# Patient Record
Sex: Female | Born: 1950 | Race: White | Hispanic: No | Marital: Married | Smoking: Former smoker
Health system: Southern US, Community
[De-identification: ages and names within clinical notes are randomized; demographics above are authoritative.]

## PROBLEM LIST (undated history)

## (undated) DIAGNOSIS — K922 Gastrointestinal hemorrhage, unspecified: Secondary | ICD-10-CM

## (undated) DIAGNOSIS — K5790 Diverticulosis of intestine, part unspecified, without perforation or abscess without bleeding: Secondary | ICD-10-CM

## (undated) DIAGNOSIS — T7840XA Allergy, unspecified, initial encounter: Secondary | ICD-10-CM

## (undated) DIAGNOSIS — C801 Malignant (primary) neoplasm, unspecified: Secondary | ICD-10-CM

## (undated) DIAGNOSIS — K51411 Inflammatory polyps of colon with rectal bleeding: Secondary | ICD-10-CM

## (undated) DIAGNOSIS — M1990 Unspecified osteoarthritis, unspecified site: Secondary | ICD-10-CM

## (undated) HISTORY — DX: Allergy, unspecified, initial encounter: T78.40XA

## (undated) HISTORY — DX: Malignant (primary) neoplasm, unspecified: C80.1

## (undated) HISTORY — DX: Gastrointestinal hemorrhage, unspecified: K92.2

## (undated) HISTORY — PX: BREAST ENHANCEMENT SURGERY: SHX7

## (undated) HISTORY — DX: Diverticulosis of intestine, part unspecified, without perforation or abscess without bleeding: K57.90

## (undated) HISTORY — DX: Unspecified osteoarthritis, unspecified site: M19.90

## (undated) HISTORY — PX: TUBAL LIGATION: SHX77

---

## 1998-06-10 ENCOUNTER — Other Ambulatory Visit: Admission: RE | Admit: 1998-06-10 | Discharge: 1998-06-10 | Payer: Self-pay | Admitting: Obstetrics and Gynecology

## 1998-08-05 ENCOUNTER — Emergency Department (HOSPITAL_COMMUNITY): Admission: EM | Admit: 1998-08-05 | Discharge: 1998-08-05 | Payer: Self-pay | Admitting: Emergency Medicine

## 1999-12-20 ENCOUNTER — Encounter: Payer: Self-pay | Admitting: Gastroenterology

## 2002-02-18 ENCOUNTER — Encounter: Admission: RE | Admit: 2002-02-18 | Discharge: 2002-02-18 | Payer: Self-pay | Admitting: *Deleted

## 2002-02-28 ENCOUNTER — Encounter: Admission: RE | Admit: 2002-02-28 | Discharge: 2002-02-28 | Payer: Self-pay | Admitting: *Deleted

## 2009-01-27 ENCOUNTER — Encounter (INDEPENDENT_AMBULATORY_CARE_PROVIDER_SITE_OTHER): Payer: Self-pay | Admitting: *Deleted

## 2009-07-17 ENCOUNTER — Telehealth: Payer: Self-pay | Admitting: Gastroenterology

## 2009-08-28 ENCOUNTER — Ambulatory Visit: Payer: Self-pay | Admitting: Gastroenterology

## 2009-08-28 DIAGNOSIS — R1012 Left upper quadrant pain: Secondary | ICD-10-CM | POA: Insufficient documentation

## 2009-08-28 LAB — CONVERTED CEMR LAB
ALT: 15 units/L (ref 0–35)
AST: 19 units/L (ref 0–37)
Albumin: 4.5 g/dL (ref 3.5–5.2)
Alkaline Phosphatase: 53 units/L (ref 39–117)
BUN: 14 mg/dL (ref 6–23)
Basophils Absolute: 0 10*3/uL (ref 0.0–0.1)
Basophils Relative: 0.6 % (ref 0.0–3.0)
Bilirubin Urine: NEGATIVE
CO2: 28 meq/L (ref 19–32)
Calcium: 9.3 mg/dL (ref 8.4–10.5)
Chloride: 106 meq/L (ref 96–112)
Creatinine, Ser: 0.7 mg/dL (ref 0.4–1.2)
Eosinophils Absolute: 0 10*3/uL (ref 0.0–0.7)
Eosinophils Relative: 0.8 % (ref 0.0–5.0)
GFR calc non Af Amer: 88.88 mL/min (ref >60)
Glucose, Bld: 128 mg/dL — ABNORMAL HIGH (ref 70–99)
HCT: 40.4 % (ref 36.0–46.0)
Hemoglobin, Urine: NEGATIVE
Hemoglobin: 13.7 g/dL (ref 12.0–15.0)
Ketones, ur: NEGATIVE mg/dL
Leukocytes, UA: NEGATIVE
Lymphocytes Relative: 29.9 % (ref 12.0–46.0)
Lymphs Abs: 1.8 10*3/uL (ref 0.7–4.0)
MCHC: 34 g/dL (ref 30.0–36.0)
MCV: 85 fL (ref 78.0–100.0)
Monocytes Absolute: 0.5 10*3/uL (ref 0.1–1.0)
Monocytes Relative: 7.6 % (ref 3.0–12.0)
Neutro Abs: 3.6 10*3/uL (ref 1.4–7.7)
Neutrophils Relative %: 61.1 % (ref 43.0–77.0)
Nitrite: NEGATIVE
Platelets: 284 10*3/uL (ref 150.0–400.0)
Potassium: 4.8 meq/L (ref 3.5–5.1)
RBC: 4.75 M/uL (ref 3.87–5.11)
RDW: 13.6 % (ref 11.5–14.6)
Sodium: 141 meq/L (ref 135–145)
Specific Gravity, Urine: 1.015 (ref 1.000–1.030)
Total Bilirubin: 0.7 mg/dL (ref 0.3–1.2)
Total Protein, Urine: NEGATIVE mg/dL
Total Protein: 7.4 g/dL (ref 6.0–8.3)
Urine Glucose: NEGATIVE mg/dL
Urobilinogen, UA: 0.2 (ref 0.0–1.0)
WBC: 5.9 10*3/uL (ref 4.5–10.5)
pH: 7.5 (ref 5.0–8.0)

## 2009-08-31 ENCOUNTER — Telehealth: Payer: Self-pay | Admitting: Gastroenterology

## 2009-09-04 ENCOUNTER — Telehealth: Payer: Self-pay | Admitting: Gastroenterology

## 2010-03-23 NOTE — Progress Notes (Signed)
Summary: Triage  Phone Note Call from Patient Call back at Home Phone [phone]   Caller: Patient Call For: Dr. Arlyce Dice Reason for Call: Talk to Nurse Summary of Call: pt was sch'd w/Dr. Jarold Motto for 07-22-09. She is a Magazine features editor pt. I resch'd her until 08-28-09 and she does not feel like she can wait that long. She is having left flank pain. Initial call taken by: Karna Christmas,  Jul 17, 2009 3:06 PM  Follow-up for Phone Call        Pt. is very upset, she states her appt. has been scheduled for 3 weeks, she even got a reminder call. She took the morning off work and now, on Friday afternoon she was called and her appt. cancelled and she wasn't given another appt. until 08-28-09. I have worked her in with Amy for 07-21-09, but the pt. is still very upset with this office.  Follow-up by: Laureen Ochs LPN,  Jul 17, 2009 3:20 PM  Additional Follow-up for Phone Call Additional follow up Details #1::        Pt. called back on 07-17-09 and rescheduled appt. to July. Yesi states pt. wasn't upset and was fine with new appt.date/time.  Additional Follow-up by: Laureen Ochs LPN,  Jul 21, 2009 11:23 AM

## 2010-03-23 NOTE — Procedures (Signed)
Summary: Colon   Colonoscopy  Procedure date:  12/20/1999  Findings:      Location:  [facility].   Patient Name: Nancy, Ellis MRN:  Procedure Procedures: Colonoscopy CPT: [phone].  Personnel: Endoscopist: Barbette Hair. Arlyce Dice, MD.  Exam Location: Exam performed in [facility]. Outpatient  Patient Consent: Procedure, Alternatives, Risks and Benefits discussed, consent obtained, from patient.  Indications Symptoms: Diarrhea  History  Pre-Exam Physical: Performed Dec 20, 1999. Cardio-pulmonary exam, Rectal exam, HEENT exam , Abdominal exam, Extremity exam, Neurological exam WNL.  Exam Exam: Extent of exam reached: Cecum, extent intended: Cecum.  Patient position: on left side. ASA Classification: I. Tolerance: excellent.  Monitoring: Pulse and BP monitoring, Oximetry used. Supplemental O2 given.  Colon Prep Used Golytely for colon prep. Prep results: excellent.  Sedation Meds: Fentanyl 75 mcg. Versed 7 mg.  Findings - DIVERTICULOSIS: Ascending Colon. ICD9: Diverticulosis: 562.10. Comments: few tics.  - DIVERTICULOSIS: Descending Colon to Sigmoid Colon. Comments: scattered diverticula.   Assessment Abnormal examination, see findings above.  Diagnoses: 562.10: Diverticulosis.   Events  Unplanned Interventions: No intervention was required.  Unplanned Events: There were no complications. Plans Patient Education: Patient given standard instructions for: Diverticulosis.  Disposition: After procedure patient sent to recovery. After recovery patient sent home.  Scheduling/Referral: Follow-Up prn. Clinic Visit,    This report was created from the original endoscopy report, which was reviewed and signed by the above listed endoscopist.

## 2010-03-23 NOTE — Assessment & Plan Note (Signed)
Summary: left flank pain...em   History of Present Illness Visit Type: consult  Primary GI MD: Melvia Heaps MD [facility] Primary Provider: Tally Joe, MD Requesting Provider: Harold Hedge, MD Chief Complaint: Left side pain that radiates to her back  History of Present Illness:   [name] is a pleasant 60 year old white female referred at the request of Dr. Henderson Cloud for evaluation of abdominal discomfort. For the  past 2 months she has been complaining of low-grade but persistent discomfort in the left upper quadrant that radiates both to her left flank and left lower quadrant.  It is unrelated to eating or bowel movements.  There has been no change in bowel habits.  She denies melena or hematochezia.  About 2 months ago she was treated initially with Macrobid then Cipro for a UTI characterized by dysuria and urinary frequency.  She denies these symptoms at present.  She also denies fevers but may have some sweats at night.  She has a history of diverticulosis seen at  colonoscopy 10 years ago.   GI Review of Systems    Reports abdominal pain.     Location of  Abdominal pain: left side.    Denies acid reflux, belching, bloating, chest pain, dysphagia with liquids, dysphagia with solids, heartburn, loss of appetite, nausea, vomiting, vomiting blood, weight loss, and  weight gain.        Denies anal fissure, black tarry stools, change in bowel habit, constipation, diarrhea, diverticulosis, fecal incontinence, heme positive stool, hemorrhoids, irritable bowel syndrome, jaundice, light color stool, liver problems, rectal bleeding, and  rectal pain.    Current Medications (verified): 1)  Aspirin 81 Mg Tbec (Aspirin) .Marland Kitchen.. 1 By Mouth Once Daily 2)  Calcium Lactate 650 Mg Tabs (Calcium Lactate) .... One Tablet By Mouth Two Times A Day  Allergies (verified): No Known Drug Allergies  Past History:  Past Medical History: Reviewed history from 08/21/2009 and no changes  required. Diverticulosis  Past Surgical History: Reviewed history from 08/21/2009 and no changes required. Tubal Ligation 2 child births Breast  augmentation  Family History: No FH of Colon Cancer:  Social History: [employer]  Married Patient is a former smoker: In Highschool Alcohol Use - no Daily Caffeine Use: One cup of coffee daily  Illicit Drug Use - no Smoking Status:  quit Drug Use:  no  Review of Systems       The patient complains of back pain.  The patient denies allergy/sinus, anemia, anxiety-new, arthritis/joint pain, blood in urine, breast changes/lumps, change in vision, confusion, cough, coughing up blood, depression-new, fainting, fatigue, fever, headaches-new, hearing problems, heart murmur, heart rhythm changes, itching, menstrual pain, muscle pains/cramps, night sweats, nosebleeds, pregnancy symptoms, shortness of breath, skin rash, sleeping problems, sore throat, swelling of feet/legs, swollen lymph glands, thirst - excessive , urination - excessive , urination changes/pain, urine leakage, vision changes, and voice change.         All other systems were reviewed and were negative    Vital Signs:  Patient profile:   60 year old female Height:      65 inches Weight:      127 pounds BMI:     21.21 BSA:     1.63 Pulse rate:   68 / minute Pulse rhythm:   regular BP sitting:   98 / 60  (left arm) Cuff size:   regular  Vitals Entered By: Ok Anis CMA (August 28, 2009 3:37 PM)  Physical Exam  Additional Exam:  She is a well-developed well-nourished female  skin: anicteric HEENT: normocephalic; PEERLA; no nasal or pharyngeal abnormalities neck: supple nodes: no cervical lymphadenopathy chest: clear to ausculatation and percussion heart: no murmurs, gallops, or rubs abd: soft, nontender; BS normoactive; no abdominal masses, organomegaly; there is very slight tenderness to deep palpation in the left upper quadrant and left  periumbilical areas.  There is no CVA tenderness rectal: deferred ext: no cynanosis, clubbing, edema skeletal: no deformities neuro: oriented x 3; no focal abnormalities    Impression & Recommendations:  Problem # 1:  ABDOMINAL PAIN, LEFT UPPER QUADRANT (ICD-789.02) Symptoms could be due to a very low-grade pyelonephritis  or residual inflammation in the urinary tract.  Pain from diverticulitis or visceral pain from the stomach or pancreas is less likely.  Recommendations #1 check urinalysis and culture #2 CBC and copper has a metabolic profile #3 to consider trial of anticholinergics if the above studies are not diagnostic Orders: TLB-Udip w/ Micro (81001-URINE) T-Culture, Urine ([id number]) TLB-CBC Platelet - w/Differential (85025-CBCD) TLB-CMP (Comprehensive Metabolic Pnl) (80053-COMP)  Patient Instructions: 1)  Copy sent to : Tally Joe, MD   Harold Hedge, MD 2)  You will go to the basement today for labs 3)  The medication list was reviewed and reconciled.  All changed / newly prescribed medications were explained.  A complete medication list was provided to the patient / caregiver.

## 2010-03-23 NOTE — Progress Notes (Signed)
Summary: Condition Update  Phone Note Call from Patient Call back at Home Phone [phone]   Caller: Patient Call For: Dr. Arlyce Dice Reason for Call: Talk to Nurse Summary of Call: pt. said her symptoms are "better" and wants to know if she should continue taking the  HYOSCYAMINE SULFATE med. Initial call taken by: Karna Christmas,  September 04, 2009 8:39 AM  Follow-up for Phone Call        Message left for patient to callback. Laureen Ochs LPN  September 04, 2009 9:25am  Message left for pt. that she can use Hyomax twice daily as needed for abd.pain. Pt. instructed to call back as needed.  Follow-up by: Laureen Ochs LPN,  September 04, 2009 2:15 PM

## 2010-03-23 NOTE — Progress Notes (Signed)
Summary: Triage  ---- Converted from flag ---- ---- 08/31/2009 11:33 AM, Louis Meckel MD wrote: Please tell pt urine results and lab were normal. Begin hyomax 0.375mg  two times a day.  c/b at end of week. ------------------------------  Phone Note Outgoing Call   Call placed by: Laureen Ochs LPN,  August 31, 2009 12:42 PM Call placed to: Patient Summary of Call: Message left for pt. with above MD instructions. Med to her pharmacy. Pt. instructed to call back at the end of the week, sooner as needed. Initial call taken by: Laureen Ochs LPN,  August 31, 2009 12:44 PM    New/Updated Medications: HYOSCYAMINE SULFATE CR 0.375 MG  TB12 (HYOSCYAMINE SULFATE) Take 1 by mouth twice daily. Prescriptions: HYOSCYAMINE SULFATE CR 0.375 MG  TB12 (HYOSCYAMINE SULFATE) Take 1 by mouth twice daily.  #60 x 1   Entered by:   Laureen Ochs LPN   Authorized by:   Louis Meckel MD   Signed by:   Laureen Ochs LPN on 16/11/9602   Method used:   Electronically to        [facility] [phone]* (retail)       [address]       [place], [place]  [address]       Ph: [phone]       Fax: [phone]   RxID:   [id number]

## 2011-02-22 HISTORY — PX: COLONOSCOPY: SHX174

## 2011-10-26 ENCOUNTER — Encounter: Payer: Self-pay | Admitting: Gastroenterology

## 2011-10-26 ENCOUNTER — Ambulatory Visit (INDEPENDENT_AMBULATORY_CARE_PROVIDER_SITE_OTHER): Payer: Commercial Managed Care - PPO | Admitting: Gastroenterology

## 2011-10-26 VITALS — BP 100/70 | HR 76 | Ht 65.0 in | Wt 130.4 lb

## 2011-10-26 DIAGNOSIS — R197 Diarrhea, unspecified: Secondary | ICD-10-CM | POA: Insufficient documentation

## 2011-10-26 MED ORDER — NA SULFATE-K SULFATE-MG SULF 17.5-3.13-1.6 GM/177ML PO SOLN: 1.0000 | Freq: Once | ORAL | Status: DC

## 2011-10-26 NOTE — Assessment & Plan Note (Addendum)
Patient has a persistent change in bowel habits following what sounds like a possible infectious diarrheal illness.  Postinfectious IBS is a possibility. Microscopic colitis or structural abnormalities of the colon including inflammation and neoplasm are less likely.  Recommendations #1 align daily for 10-14 days #2 colonoscopy

## 2011-10-26 NOTE — Progress Notes (Signed)
History of Present Illness: Pleasant 61 year old white female referred for evaluation of diarrhea. For the last 8 weeks she has been complaining of postprandial diarrhea. This is accompanied by severe urgency and crampy lower bowel pain. Symptoms have slowly improved although they remain. Imodium slows down her bowel movements but they are not yet solid. There is no history of travel, antibiotic use, change in medicines or diet. She denies hematochezia. Colonoscopy in 2001 demonstrated diverticulosis.    Past Medical History  Diagnosis Date  . Diverticulosis    Past Surgical History  Procedure Date  . Tubal ligation   . Breast enhancement surgery    family history includes Colon polyps in her mother; Diabetes in her maternal grandmother and sister; Heart attack in her father; and Rheum arthritis in her mother. Current Outpatient Prescriptions  Medication Sig Dispense Refill  . Calcium Carbonate (CALCIUM 600 PO) Take 1 tablet by mouth 2 (two) times daily.       Allergies as of 10/26/2011  . (No Known Allergies)    reports that she has quit smoking. Her smoking use included Cigarettes. She has never used smokeless tobacco. She reports that she does not drink alcohol or use illicit drugs.     Review of Systems: Pertinent positive and negative review of systems were noted in the above HPI section. All other review of systems were otherwise negative.  Vital signs were reviewed in today's medical record Physical Exam: General: Well developed , well nourished, no acute distress Head: Normocephalic and atraumatic Eyes:  sclerae anicteric, EOMI Ears: Normal auditory acuity Mouth: No deformity or lesions Neck: Supple, no masses or thyromegaly Lungs: Clear throughout to auscultation Heart: Regular rate and rhythm; no murmurs, rubs or bruits Abdomen: Soft, non tender and non distended. No masses, hepatosplenomegaly or hernias noted. Normal Bowel sounds Rectal:deferred Musculoskeletal:  Symmetrical with no gross deformities  Skin: No lesions on visible extremities Pulses:  Normal pulses noted Extremities: No clubbing, cyanosis, edema or deformities noted Neurological: Alert oriented x 4, grossly nonfocal Cervical Nodes:  No significant cervical adenopathy Inguinal Nodes: No significant inguinal adenopathy Psychological:  Alert and cooperative. Normal mood and affect

## 2011-10-26 NOTE — Patient Instructions (Addendum)
Colonoscopy A colonoscopy is an exam to evaluate your entire colon. In this exam, your colon is cleansed. A long fiberoptic tube is inserted through your rectum and into your colon. The fiberoptic scope (endoscope) is a long bundle of enclosed and very flexible fibers. These fibers transmit light to the area examined and send images from that area to your caregiver. Discomfort is usually minimal. You may be given a drug to help you sleep (sedative) during or prior to the procedure. This exam helps to detect lumps (tumors), polyps, inflammation, and areas of bleeding. Your caregiver may also take a small piece of tissue (biopsy) that will be examined under a microscope. LET YOUR CAREGIVER KNOW ABOUT:   Allergies to food or medicine.   Medicines taken, including vitamins, herbs, eyedrops, over-the-counter medicines, and creams.   Use of steroids (by mouth or creams).   Previous problems with anesthetics or numbing medicines.   History of bleeding problems or blood clots.   Previous surgery.   Other health problems, including diabetes and kidney problems.   Possibility of pregnancy, if this applies.  BEFORE THE PROCEDURE   A clear liquid diet may be required for 2 days before the exam.   Ask your caregiver about changing or stopping your regular medications.   Liquid injections (enemas) or laxatives may be required.   A large amount of electrolyte solution may be given to you to drink over a short period of time. This solution is used to clean out your colon.   You should be present 60 minutes prior to your procedure or as directed by your caregiver.  AFTER THE PROCEDURE   If you received a sedative or pain relieving medication, you will need to arrange for someone to drive you home.   Occasionally, there is a little blood passed with the first bowel movement. Do not be concerned.  FINDING OUT THE RESULTS OF YOUR TEST Not all test results are available during your visit. If your test  results are not back during the visit, make an appointment with your caregiver to find out the results. Do not assume everything is normal if you have not heard from your caregiver or the medical facility. It is important for you to follow up on all of your test results. HOME CARE INSTRUCTIONS   It is not unusual to pass moderate amounts of gas and experience mild abdominal cramping following the procedure. This is due to air being used to inflate your colon during the exam. Walking or a warm pack on your belly (abdomen) may help.   You may resume all normal meals and activities after sedatives and medicines have worn off.   Only take over-the-counter or prescription medicines for pain, discomfort, or fever as directed by your caregiver. Do not use aspirin or blood thinners if a biopsy was taken. Consult your caregiver for medicine usage if biopsies were taken.  SEEK IMMEDIATE MEDICAL CARE IF:   You have a fever.   You pass large blood clots or fill a toilet with blood following the procedure. This may also occur 10 to 14 days following the procedure. This is more likely if a biopsy was taken.   You develop abdominal pain that keeps getting worse and cannot be relieved with medicine.  Document Released: 02/05/2000 Document Revised: 01/27/2011 Document Reviewed: 09/20/2007 [employer] Patient Information 2012 [employer]. 

## 2011-10-31 ENCOUNTER — Encounter: Payer: Self-pay | Admitting: Gastroenterology

## 2011-12-15 ENCOUNTER — Inpatient Hospital Stay (HOSPITAL_COMMUNITY)
Admission: EM | Admit: 2011-12-15 | Discharge: 2011-12-18 | DRG: 920 | Disposition: A | Discharge status: Home / Self Care | Payer: Commercial Managed Care - PPO | Attending: Internal Medicine | Admitting: Internal Medicine

## 2011-12-15 ENCOUNTER — Encounter: Payer: Self-pay | Admitting: Gastroenterology

## 2011-12-15 ENCOUNTER — Ambulatory Visit (AMBULATORY_SURGERY_CENTER): Payer: Commercial Managed Care - PPO | Admitting: Gastroenterology

## 2011-12-15 ENCOUNTER — Telehealth: Payer: Self-pay | Admitting: Internal Medicine

## 2011-12-15 ENCOUNTER — Encounter (HOSPITAL_COMMUNITY): Payer: Self-pay | Admitting: *Deleted

## 2011-12-15 VITALS — BP 109/51 | HR 71 | Temp 98.6°F | Resp 21 | Ht 65.0 in | Wt 130.0 lb

## 2011-12-15 DIAGNOSIS — D62 Acute posthemorrhagic anemia: Secondary | ICD-10-CM | POA: Diagnosis present

## 2011-12-15 DIAGNOSIS — IMO0002 Reserved for concepts with insufficient information to code with codable children: Secondary | ICD-10-CM

## 2011-12-15 DIAGNOSIS — Y849 Medical procedure, unspecified as the cause of abnormal reaction of the patient, or of later complication, without mention of misadventure at the time of the procedure: Secondary | ICD-10-CM | POA: Diagnosis present

## 2011-12-15 DIAGNOSIS — D126 Benign neoplasm of colon, unspecified: Secondary | ICD-10-CM

## 2011-12-15 DIAGNOSIS — K922 Gastrointestinal hemorrhage, unspecified: Secondary | ICD-10-CM

## 2011-12-15 DIAGNOSIS — K625 Hemorrhage of anus and rectum: Secondary | ICD-10-CM

## 2011-12-15 DIAGNOSIS — R197 Diarrhea, unspecified: Secondary | ICD-10-CM

## 2011-12-15 DIAGNOSIS — K5730 Diverticulosis of large intestine without perforation or abscess without bleeding: Secondary | ICD-10-CM

## 2011-12-15 HISTORY — DX: Inflammatory polyps of colon with rectal bleeding: K51.411

## 2011-12-15 LAB — CBC WITH DIFFERENTIAL/PLATELET
Eosinophils Absolute: 0.1 10*3/uL (ref 0.0–0.7)
Eosinophils Relative: 1 % (ref 0–5)
HCT: 38.5 % (ref 36.0–46.0)
Hemoglobin: 12.7 g/dL (ref 12.0–15.0)
Lymphocytes Relative: 20 % (ref 12–46)
Lymphs Abs: 1.8 10*3/uL (ref 0.7–4.0)
MCH: 28 pg (ref 26.0–34.0)
MCV: 84.8 fL (ref 78.0–100.0)
Monocytes Absolute: 0.7 10*3/uL (ref 0.1–1.0)
Monocytes Relative: 7 % (ref 3–12)
Platelets: 300 10*3/uL (ref 150–400)
RBC: 4.54 MIL/uL (ref 3.87–5.11)
WBC: 9.1 10*3/uL (ref 4.0–10.5)

## 2011-12-15 LAB — COMPREHENSIVE METABOLIC PANEL
ALT: 9 U/L (ref 0–35)
BUN: 16 mg/dL (ref 6–23)
CO2: 25 mEq/L (ref 19–32)
Calcium: 9.1 mg/dL (ref 8.4–10.5)
Creatinine, Ser: 0.75 mg/dL (ref 0.50–1.10)
GFR calc Af Amer: >90 mL/min (ref >90)
GFR calc non Af Amer: >90 mL/min (ref >90)
Glucose, Bld: 131 mg/dL — ABNORMAL HIGH (ref 70–99)
Total Protein: 6.7 g/dL (ref 6.0–8.3)

## 2011-12-15 LAB — PROTIME-INR: Prothrombin Time: 13.9 seconds (ref 11.6–15.2)

## 2011-12-15 MED ORDER — DEXTROSE 5 % AND 0.45 % NACL IV BOLUS
125.0000 mL | INTRAVENOUS | Status: DC
  Administered 2011-12-15 – 2011-12-16 (×3): 125 mL via INTRAVENOUS

## 2011-12-15 MED ORDER — SODIUM CHLORIDE 0.9 % IV BOLUS (SEPSIS): 1000.0000 mL | Freq: Once | INTRAVENOUS | Status: DC

## 2011-12-15 MED ORDER — DEXTROSE 5 % AND 0.45 % NACL IV BOLUS: 1000.0000 mL | INTRAVENOUS | Status: DC

## 2011-12-15 MED ORDER — ACETAMINOPHEN 325 MG PO TABS: 650.0000 mg | ORAL_TABLET | ORAL | Status: DC | PRN

## 2011-12-15 MED ORDER — SODIUM CHLORIDE 0.9 % IV SOLN: 500.0000 mL | INTRAVENOUS | Status: DC

## 2011-12-15 MED ORDER — SODIUM CHLORIDE 0.9 % IV BOLUS (SEPSIS)
1000.0000 mL | Freq: Once | INTRAVENOUS | Status: AC
  Administered 2011-12-15: 1000 mL via INTRAVENOUS

## 2011-12-15 NOTE — ED Notes (Signed)
Patient is alert and oriented x3.  She is complaining of rectal bleeding that started at [facility] this evening  After she had a colonoscopy this morning at 9am where 2 polyps were removed.  She states that she has had a few intermittent  Sharp abdominal pains since the bleeding started.  She denies any nausea.

## 2011-12-15 NOTE — H&P (Signed)
Primary Care Physician:  Sissy Hoff, MD Primary Gastroenterologist:  Dr.Robert Arlyce Dice  CHIEF COMPLAINT:  5 bloody bowl movements  HPI: Nancy Ellis is a 61 y.o. female  Who underwent colonoscopy at 9.00 today in [facility] with findings of 2 polyps: 5 mm cecal polyp was removed with cold snare, and larger 15 mm sessile polyp in the left colon removed after saline injection with a hot snare, she had lunch and supper and later around 7.00pm felt an urge to have a BM. She had a large bloody stool and quickly called me and I referred her to [facility] ED  And have called ED with orders. She has no hx of bleeding or anemia. She saw Dr Arlyce Dice 2 months ago for urgent diarrhea. She had a prior colonoscopy in 2001 which showed diverticulosis.Tonight, pt denies presyncopal episode, abd.pain, nor vomitting. Denies taking ASA or NSAID's    Past Medical History  Diagnosis Date  . Diverticulosis   . 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 Tubal ligation   . Breast enhancement surgery     Prior to Admission medications   Medication Sig Start Date End Date Taking? Authorizing Provider  Calcium Carbonate (CALCIUM 600 PO) Take 1 tablet by mouth 2 (two) times daily.   Yes Historical Provider, MD  naproxen (NAPROSYN) 250 MG tablet Take 250 mg by mouth 2 (two) times daily with a meal.   Yes Historical Provider, MD    Current Facility-Administered Medications  Medication Dose Route Frequency Provider Last Rate Last Dose  . sodium chloride 0.9 % bolus 1,000 mL  1,000 mL Intravenous Once Otilio Miu, PA   1,000 mL at 12/15/11 2130   Current Outpatient Prescriptions  Medication Sig Dispense Refill  . Calcium Carbonate (CALCIUM 600 PO) Take 1 tablet by mouth 2 (two) times daily.      . naproxen (NAPROSYN) 250 MG tablet Take 250 mg by mouth 2 (two) times daily with a meal.       Facility-Administered Medications Ordered in Other Encounters    Medication Dose Route Frequency Provider Last Rate Last Dose  . 0.9 %  sodium chloride infusion  500 mL Intravenous Continuous Louis Meckel, MD        Allergies as of 12/15/2011  . (No Known Allergies)    Family History  Problem Relation Age of Onset  . Diabetes Maternal Grandmother   . Diabetes Sister   . Rheum arthritis Mother   . Heart attack Father   . Colon polyps Mother     History   Social History  . Marital Status: Married    Spouse Name: N/A    Number of Children: 2  . Years of Education: N/A   Occupational History  . Mining engineer    Social History Main Topics  . Smoking status: Former Smoker    Types: Cigarettes  . Smokeless tobacco: Never Used  . Alcohol Use: No  . Drug Use: No  . Sexually Active: Not on file   Other Topics Concern  . Not on file   Social History Narrative  . No narrative on file    Review of Systems: Gen: Denies any fever, chills, sweats, anorexia, fatigue, weakness, malaise, weight loss, and sleep disorder CV: Denies chest pain, angina, palpitations, syncope, orthopnea, PND, peripheral edema, and claudication. Resp: Denies dyspnea at rest, dyspnea with exercise, cough, sputum, wheezing, coughing up blood, and pleurisy. GI: Denies vomiting  blood, jaundice, and fecal incontinence.   Denies dysphagia or odynophagia. GU : Denies urinary burning, blood in urine, urinary frequency, urinary hesitancy, nocturnal urination, and urinary incontinence. MS: Denies joint pain, limitation of movement, and swelling, stiffness, low back pain, extremity pain. Denies muscle weakness, cramps, atrophy.  Derm: Denies rash, itching, dry skin, hives, moles, warts, or unhealing ulcers.  Psych: Denies depression, anxiety, memory loss, suicidal ideation, hallucinations, paranoia, and confusion. Heme: Denies bruising, bleeding, and enlarged lymph nodes. Neuro:  Denies any headaches, dizziness, paresthesias. Endo:  Denies any problems with DM,  thyroid, adrenal function.  Physical Exam: Vital signs in last 24 hours: Temp:  [98.5 F (36.9 C)-98.6 F (37 C)] 98.5 F (36.9 C) (10/24 2024) Pulse Rate:  [71-97] 89  (10/24 2130) Resp:  [14-23] 18  (10/24 2024) BP: (92-121)/(51-76) 115/76 mmHg (10/24 2024) SpO2:  [89 %-100 %] 100 % (10/24 2130) Weight:  [130 lb (58.968 kg)] 130 lb (58.968 kg) (10/24 0827)   General:   Alert,  Well-developed, well-nourished, pleasant and cooperative in NAD Head:  Normocephalic and atraumatic. Eyes:  Sclera clear, no icterus.   Conjunctiva pink. Ears:  Normal auditory acuity. Nose:  No deformity, discharge,  or lesions. Mouth:  No deformity or lesions.  Oropharynx pink & moist. Neck:  Supple; no masses or thyromegaly. Lungs:  Clear throughout to auscultation.   No wheezes, crackles, or rhonchi. No acute distress. Heart:  Regular rate and rhythm; no murmurs, clicks, rubs,  or gallops. Abdomen:  Mil;d tenderness LLQ, no rebound and nondistended. No masses, hepatosplenomegaly or hernias noted. Normal bowel sounds, without guarding, and without rebound.   Rectal: reg blood on glove, no stool, heme positive, no hemorrhoids, also fresh blood smeared around the rectum   Msk:  Symmetrical without gross deformities. Normal posture. Pulses:  Normal pulses noted. Extremities:  Without clubbing or edema. Neurologic:  Alert and  oriented x4;  grossly normal neurologically. Skin:  Intact without significant lesions or rashes. Cervical Nodes:  No significant cervical adenopathy. Psych:  Alert and cooperative. Normal mood and affect.  Intake/Output from previous day:   Intake/Output this shift: Total I/O In: -  Out: 2 [Stool:2]  Lab Results:  [facility] 12/15/11 2100  WBC 9.1  HGB 12.7  HCT 38.5  PLT 300   BMET  Basename 12/15/11 2100  NA 136  K 3.7  CL 100  CO2 25  GLUCOSE 131*  BUN 16  CREATININE 0.75  CALCIUM 9.1   LFT  Basename 12/15/11 2100  PROT 6.7  ALBUMIN 4.0  AST 15  ALT 9    ALKPHOS 60  BILITOT 0.5  BILIDIR --  IBILI --   PT/INR  Basename 12/15/11 2130  LABPROT 13.5  INR 1.04   Hepatitis Panel No results found for this basename: HEPBSAG,HCVAB,HEPAIGM,HEPBIGM in the last 72 hours  Studies/Results: No results found.   IMPRESSION / PLAN:   Acute lower GIB, likely postpolypectomy although I cannot r/o diverticular bleed. She is tender in the LLQ,??polypectomy site. She is stable hemodynamically. Hgb is 12.7, it is likely to drop since she has had 5 stools in past 2 hours IV fluids Monitor H/H q 4 hrs Bowl rest Clear liquids Type and hold- TXn for Hgb < 9 Possible flexible sigmoidoscopy and hemostasis, endo clip placement if bleeding continues Discussed with the pt and her husband      LOS: 0 days   Primary Care Physician:  Sissy Hoff, MD Primary Gastroenterologist:  Dewaine Conger  CHIEF COMPLAINT:  Passing  blod, 5 large bowl movements from 7.00pm till 10.00 pm  HPI: Nancy Ellis is a 61 y.o. female had colonoscopy earlier today, developed painless bleeding about 7 hours post procedure. 2 polyps removed- 5 mm cecal polyp and 15mm  Left colon poly- hor snare and saline injection. No hx of NSAIDs   Past Medical History  Diagnosis Date  . Diverticulosis   . 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 Tubal ligation   . Breast enhancement surgery     Prior to Admission medications   Medication Sig Start Date End Date Taking? Authorizing Provider  Calcium Carbonate (CALCIUM 600 PO) Take 1 tablet by mouth 2 (two) times daily.   Yes Historical Provider, MD  naproxen (NAPROSYN) 250 MG tablet Take 250 mg by mouth 2 (two) times daily with a meal.   Yes Historical Provider, MD    Current Facility-Administered Medications  Medication Dose Route Frequency Provider Last Rate Last Dose  . sodium chloride 0.9 % bolus 1,000 mL  1,000 mL Intravenous Once Otilio Miu, PA   1,000  mL at 12/15/11 2130   Current Outpatient Prescriptions  Medication Sig Dispense Refill  . Calcium Carbonate (CALCIUM 600 PO) Take 1 tablet by mouth 2 (two) times daily.      . naproxen (NAPROSYN) 250 MG tablet Take 250 mg by mouth 2 (two) times daily with a meal.       Facility-Administered Medications Ordered in Other Encounters  Medication Dose Route Frequency Provider Last Rate Last Dose  . 0.9 %  sodium chloride infusion  500 mL Intravenous Continuous Louis Meckel, MD        Allergies as of 12/15/2011  . (No Known Allergies)    Family History  Problem Relation Age of Onset  . Diabetes Maternal Grandmother   . Diabetes Sister   . Rheum arthritis Mother   . Heart attack Father   . Colon polyps Mother     History   Social History  . Marital Status: Married    Spouse Name: N/A    Number of Children: 2  . Years of Education: N/A   Occupational History  . Mining engineer    Social History Main Topics  . Smoking status: Former Smoker    Types: Cigarettes  . Smokeless tobacco: Never Used  . Alcohol Use: No  . Drug Use: No  . Sexually Active: Not on file   Other Topics Concern  . Not on file   Social History Narrative  . No narrative on file    Review of Systems: Gen: Denies any fever, chills, sweats, anorexia, fatigue, weakness, malaise, weight loss, and sleep disorder CV: Denies chest pain, angina, palpitations, syncope, orthopnea, PND, peripheral edema, and claudication. Resp: Denies dyspnea at rest, dyspnea with exercise, cough, sputum, wheezing, coughing up blood, and pleurisy. GI: Denies vomiting blood, jaundice, and fecal incontinence.   Denies dysphagia or odynophagia. GU : Denies urinary burning, blood in urine, urinary frequency, urinary hesitancy, nocturnal urination, and urinary incontinence. MS: Denies joint pain, limitation of movement, and swelling, stiffness, low back pain, extremity pain. Denies muscle weakness, cramps, atrophy.  Derm:  Denies rash, itching, dry skin, hives, moles, warts, or unhealing ulcers.  Psych: Denies depression, anxiety, memory loss, suicidal ideation, hallucinations, paranoia, and confusion. Heme: Denies bruising, bleeding, and enlarged lymph nodes. Neuro:  Denies any headaches, dizziness, paresthesias. Endo:  Denies any problems with DM, thyroid, adrenal function.  Physical Exam: Vital signs in last 24 hours: Temp:  [98.5 F (36.9 C)-98.6 F (37 C)] 98.5 F (36.9 C) (10/24 2024) Pulse Rate:  [71-97] 89  (10/24 2130) Resp:  [14-23] 18  (10/24 2024) BP: (92-121)/(51-76) 115/76 mmHg (10/24 2024) SpO2:  [89 %-100 %] 100 % (10/24 2130) Weight:  [130 lb (58.968 kg)] 130 lb (58.968 kg) (10/24 0827)   General:   Alert,  Well-developed, well-nourished, pleasant and cooperative in NAD Head:  Normocephalic and atraumatic. Eyes:  Sclera clear, no icterus.   Conjunctiva pink. Ears:  Normal auditory acuity. Nose:  No deformity, discharge,  or lesions. Mouth:  No deformity or lesions.  Oropharynx pink & moist. Neck:  Supple; no masses or thyromegaly. Lungs:  Clear throughout to auscultation.   No wheezes, crackles, or rhonchi. No acute distress. Heart:  Regular rate and rhythm; no murmurs, clicks, rubs,  or gallops. Abdomen:  Soft, nontender and nondistended. No masses, hepatosplenomegaly or hernias noted. Normal bowel sounds, without guarding, and without rebound.   Rectal: fresh/maroon blood on the glove, no hems.   Msk:  Symmetrical without gross deformities. Normal posture. Pulses:  Normal pulses noted. Extremities:  Without clubbing or edema. Neurologic:  Alert and  oriented x4;  grossly normal neurologically. Skin:  Intact without significant lesions or rashes. Cervical Nodes:  No significant cervical adenopathy. Psych:  Alert and cooperative. Normal mood and affect.  Intake/Output from previous day:   Intake/Output this shift: Total I/O In: -  Out: 2 [Stool:2]  Lab Results:  [facility]  12/15/11 2100  WBC 9.1  HGB 12.7  HCT 38.5  PLT 300   BMET  Basename 12/15/11 2100  NA 136  K 3.7  CL 100  CO2 25  GLUCOSE 131*  BUN 16  CREATININE 0.75  CALCIUM 9.1   LFT  Basename 12/15/11 2100  PROT 6.7  ALBUMIN 4.0  AST 15  ALT 9  ALKPHOS 60  BILITOT 0.5  BILIDIR --  IBILI --   PT/INR  Basename 12/15/11 2130  LABPROT 13.5  INR 1.04   Hepatitis Panel No results found for this basename: HEPBSAG,HCVAB,HEPAIGM,HEPBIGM in the last 72 hours  Studies/Results: No results found.   IMPRESSION / PLAN: Postpolypectomy bleed, cannot r/o diverticular bleed, will observe Recheck H/H Clear liquids Type and hold Possible flex sigm and hemostasis with an endo clip      LOS: 0 days   Lina Sar  12/15/2011, 10:45 PM

## 2011-12-15 NOTE — Op Note (Addendum)
[facility] [address] [place], [address]   COLONOSCOPY PROCEDURE REPORT  PATIENT: Nancy Ellis, Nancy Ellis  MR#: [id number] BIRTHDATE: [birth year] , 60  yrs. old GENDER: Female ENDOSCOPIST: Louis Meckel, MD REFERRED XB:JYNWG Swayne, M.D. PROCEDURE DATE:  12/15/2011 PROCEDURE:   Colonoscopy with snare polypectomy, Colonoscopy with biopsy, and Submucosal injection, any substance ASA CLASS:   Class II INDICATIONS:unexplained diarrhea. MEDICATIONS: MAC sedation, administered by CRNA and propofol (Diprivan) 350mg  IV  DESCRIPTION OF PROCEDURE:   After the risks benefits and alternatives of the procedure were thoroughly explained, informed consent was obtained.  A digital rectal exam revealed no abnormalities of the rectum.   The LB [id number]  endoscope was introduced through the anus and advanced to the cecum, which was identified by both the appendix and ileocecal valve. No adverse events experienced.   The quality of the prep was Suprep excellent The instrument was then slowly withdrawn as the colon was fully examined.      COLON FINDINGS: A sessile polyp was found at the cecum.  A polypectomy was performed with a cold snare.  The resection was complete and the polyp tissue was completely retrieved.   In the ascending colon a sessile 15 mm polyp with slight dimpling in the middle was noted.  the submucosa was injected with 4 cc normal saline.  polyp was removed with a hot polypectomy snare and submitted to pathology and submitted to pathology.   Moderate diverticulosis was noted in the sigmoid colon.   There is scattered diverticula in the descending and transverse colon.   There is scattered diverticula in the descending and transverse colon.   The colon mucosa was otherwise normal.  Random biopsies were taken throughout the colon to rule out microscopic colitis Retroflexed views revealed no abnormalities. The time to cecum=4 minutes 47 seconds.   Withdrawal time=16 minutes 46 seconds.  The scope was withdrawn and the procedure completed. COMPLICATIONS: There were no complications.  ENDOSCOPIC IMPRESSION: 1.   Sessile polyp was found at the cecum; polypectomy was performed with a cold snare 2.   In the descending colon a sessile 15 mm polyp with slight dimpling in the middle was noted.   the submucosa was injected with 4 cc normal saline.  polyp was removed with a hot polypectomy snare and submitted to pathology and submitted to pathology. 3.   Moderate diverticulosis was noted in the sigmoid colon 4.   There is scattered diverticula in the descending and transverse colon 5.  The colon mucosa was otherwise normal  RECOMMENDATIONS: Await biopsy results OV 1 month   eSigned:  Louis Meckel, MD 12/16/2011 10:40 AM Revised: 12/16/2011 10:40 AM  cc:   PATIENT NAME:  Klyn, Kroening MR#: [id number]

## 2011-12-15 NOTE — Patient Instructions (Addendum)
YOU HAD AN ENDOSCOPIC PROCEDURE TODAY AT THE [facility]: Refer to the procedure report that was given to you for any specific questions about what was found during the examination.  If the procedure report does not answer your questions, please call your gastroenterologist to clarify.  If you requested that your care partner not be given the details of your procedure findings, then the procedure report has been included in a sealed envelope for you to review at your convenience later.  YOU SHOULD EXPECT: Some feelings of bloating in the abdomen. Passage of more gas than usual.  Walking can help get rid of the air that was put into your GI tract during the procedure and reduce the bloating. If you had a lower endoscopy (such as a colonoscopy or flexible sigmoidoscopy) you may notice spotting of blood in your stool or on the toilet paper. If you underwent a bowel prep for your procedure, then you may not have a normal bowel movement for a few days.  DIET: Your first meal following the procedure should be a light meal and then it is ok to progress to your normal diet.  A half-sandwich or bowl of soup is an example of a good first meal.  Heavy or fried foods are harder to digest and may make you feel nauseous or bloated.  Likewise meals heavy in dairy and vegetables can cause extra gas to form and this can also increase the bloating.  Drink plenty of fluids but you should avoid alcoholic beverages for 24 hours.  ACTIVITY: Your care partner should take you home directly after the procedure.  You should plan to take it easy, moving slowly for the rest of the day.  You can resume normal activity the day after the procedure however you should NOT DRIVE or use heavy machinery for 24 hours (because of the sedation medicines used during the test).    SYMPTOMS TO REPORT IMMEDIATELY: A gastroenterologist can be reached at any hour.  During normal business hours, 8:30 AM to 5:00 PM Monday through Friday,  call [phone].  After hours and on weekends, please call the GI answering service at [phone] who will take a message and have the physician on call contact you.   Following lower endoscopy (colonoscopy or flexible sigmoidoscopy):  Excessive amounts of blood in the stool  Significant tenderness or worsening of abdominal pains  Swelling of the abdomen that is new, acute  Fever of 100F or higher  Black, tarry-looking stools  FOLLOW UP: If any biopsies were taken you will be contacted by phone or by letter within the next 1-3 weeks.  Call your gastroenterologist if you have not heard about the biopsies in 3 weeks.  Our staff will call the home number listed on your records the next business day following your procedure to check on you and address any questions or concerns that you may have at that time regarding the information given to you following your procedure. This is a courtesy call and so if there is no answer at the home number and we have not heard from you through the emergency physician on call, we will assume that you have returned to your regular daily activities without incident.  SIGNATURES/CONFIDENTIALITY: You and/or your care partner have signed paperwork which will be entered into your electronic medical record.  These signatures attest to the fact that that the information above on your After Visit Summary has been reviewed and is understood.  Full responsibility of   the confidentiality of this discharge information lies with you and/or your care-partner. YOU HAD AN ENDOSCOPIC PROCEDURE TODAY AT THE [facility]: Refer to the procedure report that was given to you for any specific questions about what was found during the examination.  If the procedure report does not answer your questions, please call your gastroenterologist to clarify.  If you requested that your care partner not be given the details of your procedure findings, then the procedure report has  been included in a sealed envelope for you to review at your convenience later.  Thank-you for choosing [place] for your healthcare needs.

## 2011-12-15 NOTE — Telephone Encounter (Signed)
Pt called around 7.00 pm stating she just had a large bloody bowl movement. She had a colonoscopy and snare polypectomy x2 aat 12.00 noon. i have reviewed the procedure note. She denied being presyncopal, diaphoretic.She is having some abd. Pain. I have asked her to go to [facility] and have called the ED myself with orders. DB

## 2011-12-15 NOTE — ED Provider Notes (Signed)
History     CSN: [id number]  Arrival date & time 12/15/11  2016   First MD Initiated Contact with Patient 12/15/11 2033      Chief Complaint  Patient presents with  . Rectal Bleeding    frank blood BM x2    (Consider location/radiation/quality/duration/timing/severity/associated sxs/prior treatment) HPI History provided by pt and prior chart.  Per prior chart, pt had a colonoscopy today that was significant for 2 polyps, both of which were resected and moderate sigmoid diverticula.  Pt presents to ED this evening w/ c/o rectal bleeding.  Has filled the toilet bowl with bright red blood, 4 times since 7pm, and found a small amount of blood in her underwear as all.  Associated w/ infrequent shooting pain across her lower abdomen.  Denies fever, N/V, diarrhea, urinary sx, weakness, lightheadedness and SOB.  She is not anti-coagulated.   Past Medical History  Diagnosis Date  . Diverticulosis   . 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 Tubal ligation   . Breast enhancement surgery     Family History  Problem Relation Age of Onset  . Diabetes Maternal Grandmother   . Diabetes Sister   . Rheum arthritis Mother   . Heart attack Father   . Colon polyps Mother     History  Substance Use Topics  . Smoking status: Former Smoker    Types: Cigarettes  . Smokeless tobacco: Never Used  . Alcohol Use: No    OB History    Grav Para Term Preterm Abortions TAB SAB Ect Mult Living                  Review of Systems  All other systems reviewed and are negative.    Allergies  Review of patient's allergies indicates no known allergies.  Home Medications   Current Outpatient Rx  Name Route Sig Dispense Refill  . CALCIUM 600 PO Oral Take 1 tablet by mouth 2 (two) times daily.    Marland Kitchen NAPROXEN 250 MG PO TABS Oral Take 250 mg by mouth 2 (two) times daily with a meal.      BP 115/76  Pulse 97  Temp 98.5 F (36.9 C)  (Oral)  Resp 18  SpO2 99%  Physical Exam  Nursing note and vitals reviewed. Constitutional: She is oriented to person, place, and time. She appears well-developed and well-nourished. No distress.  HENT:  Head: Normocephalic and atraumatic.  Eyes:       Normal appearance  Neck: Normal range of motion.  Cardiovascular: Normal rate and regular rhythm.   Pulmonary/Chest: Effort normal and breath sounds normal. No respiratory distress.  Abdominal: Soft. Bowel sounds are normal. She exhibits no distension and no mass. There is no tenderness. There is no rebound and no guarding.  Genitourinary:       Gross red blood in rectum.  One tiny non-thrombosed external hemorrhoid.   Musculoskeletal: Normal range of motion.  Neurological: She is alert and oriented to person, place, and time.  Skin: Skin is warm and dry. No rash noted.  Psychiatric: She has a normal mood and affect. Her behavior is normal.    ED Course  Procedures (including critical care time)  Labs Reviewed  COMPREHENSIVE METABOLIC PANEL - Abnormal; Notable for the following:    Glucose, Bld 131 (*)     All other components within normal limits  CBC WITH DIFFERENTIAL  PROTIME-INR  APTT  URINALYSIS, ROUTINE  W REFLEX MICROSCOPIC  TYPE AND SCREEN   No results found.   1. Rectal bleeding       MDM  60yo F presents w/ rectal bleeding, s/p colonoscopy and polypectomy today.  She is currently A&O, looks well and no complaints.  She has been placed on monitor and 2 large bore IVs placed.  Receiving IV fluids.  Labs pending.  9:15 PM   VS stable.  Hgb 12.5.  Pt has had another bloody BM since initial evaluation.  No lightheadedness or SOB w/ ambulation.  Consulted Dr. Juanda Chance who will admit for further evaluation.  10:18 PM         Otilio Miu, [place] 12/15/11 2218

## 2011-12-15 NOTE — Progress Notes (Addendum)
Patient did not have preoperative order for IV antibiotic SSI prophylaxis. (G8918)  Patient did not experience any of the following events: a burn prior to discharge; a fall within the facility; wrong site/side/patient/procedure/implant event; or a hospital transfer or hospital admission upon discharge from the facility. (G8907)  

## 2011-12-16 ENCOUNTER — Telehealth: Payer: Self-pay | Admitting: *Deleted

## 2011-12-16 DIAGNOSIS — K922 Gastrointestinal hemorrhage, unspecified: Secondary | ICD-10-CM

## 2011-12-16 DIAGNOSIS — IMO0002 Reserved for concepts with insufficient information to code with codable children: Principal | ICD-10-CM

## 2011-12-16 LAB — BASIC METABOLIC PANEL
BUN: 14 mg/dL (ref 6–23)
CO2: 24 mEq/L (ref 19–32)
Calcium: 8 mg/dL — ABNORMAL LOW (ref 8.4–10.5)
Chloride: 105 mEq/L (ref 96–112)
Creatinine, Ser: 0.57 mg/dL (ref 0.50–1.10)
GFR calc Af Amer: >90 mL/min (ref >90)
GFR calc non Af Amer: >90 mL/min (ref >90)
Glucose, Bld: 122 mg/dL — ABNORMAL HIGH (ref 70–99)
Potassium: 3.7 mEq/L (ref 3.5–5.1)
Sodium: 135 mEq/L (ref 135–145)

## 2011-12-16 LAB — CBC
HCT: 29.2 % — ABNORMAL LOW (ref 36.0–46.0)
HCT: 29.5 % — ABNORMAL LOW (ref 36.0–46.0)
HCT: 28.8 % — ABNORMAL LOW (ref 36.0–46.0)
Hemoglobin: 9.9 g/dL — ABNORMAL LOW (ref 12.0–15.0)
Hemoglobin: 9.8 g/dL — ABNORMAL LOW (ref 12.0–15.0)
Hemoglobin: 9.9 g/dL — ABNORMAL LOW (ref 12.0–15.0)
MCH: 28.3 pg (ref 26.0–34.0)
MCH: 28.2 pg (ref 26.0–34.0)
MCH: 29.1 pg (ref 26.0–34.0)
MCH: 28.4 pg (ref 26.0–34.0)
MCH: 28.4 pg (ref 26.0–34.0)
MCHC: 33.6 g/dL (ref 30.0–36.0)
MCHC: 33.2 g/dL (ref 30.0–36.0)
MCHC: 34.4 g/dL (ref 30.0–36.0)
MCHC: 33.5 g/dL (ref 30.0–36.0)
MCV: 84.4 fL (ref 78.0–100.0)
MCV: 84.8 fL (ref 78.0–100.0)
MCV: 84.7 fL (ref 78.0–100.0)
MCV: 84.6 fL (ref 78.0–100.0)
Platelets: 236 10*3/uL (ref 150–400)
Platelets: 238 10*3/uL (ref 150–400)
Platelets: 237 10*3/uL (ref 150–400)
Platelets: 240 10*3/uL (ref 150–400)
Platelets: 247 10*3/uL (ref 150–400)
RBC: 3.46 MIL/uL — ABNORMAL LOW (ref 3.87–5.11)
RBC: 3.48 MIL/uL — ABNORMAL LOW (ref 3.87–5.11)
RBC: 3.4 MIL/uL — ABNORMAL LOW (ref 3.87–5.11)
RDW: 13.5 % (ref 11.5–15.5)
RDW: 13.8 % (ref 11.5–15.5)
RDW: 13.7 % (ref 11.5–15.5)
RDW: 13.6 % (ref 11.5–15.5)
WBC: 6.5 10*3/uL (ref 4.0–10.5)
WBC: 5.5 10*3/uL (ref 4.0–10.5)
WBC: 4.7 10*3/uL (ref 4.0–10.5)

## 2011-12-16 LAB — URINALYSIS, ROUTINE W REFLEX MICROSCOPIC
Bilirubin Urine: NEGATIVE
Glucose, UA: NEGATIVE mg/dL
Hgb urine dipstick: NEGATIVE
Ketones, ur: NEGATIVE mg/dL
Leukocytes, UA: NEGATIVE
Nitrite: NEGATIVE
Protein, ur: NEGATIVE mg/dL
Specific Gravity, Urine: 1.016 (ref 1.005–1.030)
Urobilinogen, UA: 0.2 mg/dL (ref 0.0–1.0)
pH: 5.5 (ref 5.0–8.0)

## 2011-12-16 LAB — ABO/RH: ABO/RH(D): O POS

## 2011-12-16 MED ORDER — DEXTROSE-NACL 5-0.45 % IV SOLN
INTRAVENOUS | Status: DC
  Administered 2011-12-16: 500 mL via INTRAVENOUS
  Administered 2011-12-17 (×2): via INTRAVENOUS

## 2011-12-16 MED ORDER — BIOTENE DRY MOUTH MT LIQD
15.0000 mL | Freq: Two times a day (BID) | OROMUCOSAL | Status: DC
  Administered 2011-12-17 (×2): 15 mL via OROMUCOSAL

## 2011-12-16 MED ORDER — DEXTROSE 5 % AND 0.45 % NACL IV BOLUS: 50.0000 mL | INTRAVENOUS | Status: DC

## 2011-12-16 MED ORDER — CHLORHEXIDINE GLUCONATE 0.12 % MT SOLN
15.0000 mL | Freq: Two times a day (BID) | OROMUCOSAL | Status: DC
  Administered 2011-12-17 – 2011-12-18 (×3): 15 mL via OROMUCOSAL
  Filled 2011-12-16 (×7): qty 15

## 2011-12-16 NOTE — Progress Notes (Signed)
Pt remains stable,. She had 2 more small bloody bowl movements in the afternoon, but reports darker color. Her Hgb at 1.30 pm was 9.6. She denies dizziness or weakness. She has ambulated in the hallway. Dr Arlyce Dice who performed the colonoscopy and polypectomy yesterday informed me, that both polyps were located   in the right colon.Therefore, if we have to intervene to stop the bleeding, pt would require a colonoscopy prep rather than  A  limited prep used for sigmoidoscopy. The full colonoscopy prep could stir up more bleeding. After discussing it with the pt, we will hold off on colonoscopy and will recheck H/H  at  Night and in am.. If she remains stable, we will plan to discharge later tomorrow

## 2011-12-16 NOTE — Progress Notes (Signed)
Initial review for inpatient status is complete. 

## 2011-12-16 NOTE — Progress Notes (Signed)
Subjective No BM's between  11.30 pm and 6.30 am, then 2 bloody stools since- witnessed by me, no complaints  Objective:acute postpolypectomy bleed, likely from the left colon polyp, Hgb down to 9.8 from 12.7, Vital signs in last 24 hours: Temp:  [98.5 F (36.9 C)-98.9 F (37.2 C)] 98.8 F (37.1 C) (10/25 0620) Pulse Rate:  [71-97] 89  (10/25 0620) Resp:  [14-23] 16  (10/25 0620) BP: (92-121)/(51-78) 94/58 mmHg (10/25 0620) SpO2:  [89 %-100 %] 98 % (10/25 0620) Weight:  [130 lb 11.7 oz (59.3 kg)] 130 lb 11.7 oz (59.3 kg) (10/25 0100) Last BM Date: 12/15/11 General:   Alert,  pleasant, cooperative in NAD Head:  Normocephalic and atraumatic. Eyes:  Sclera clear, no icterus.   Conjunctiva pink. Mouth:  No deformity or lesions, dentition normal. Neck:  Supple; no masses or thyromegaly. Heart:  Regular rate and rhythm; no murmurs, clicks, rubs,  or gallops. Lungs:  No wheezes or rales Abdomen:  Hyperactive bowl sounds, soft, non tender, not distended  Msk:  Symmetrical without gross deformities. Normal posture. Pulses:  Normal pulses noted. Extremities:  Without clubbing or edema. Neurologic:  Alert and  oriented x4;  grossly normal neurologically. Skin:  Intact without significant lesions or rashes.  Intake/Output from previous day: 10/24 0701 - 10/25 0700 In: -  Out: 402 [Urine:400; Stool:2] Intake/Output this shift: Total I/O In: -  Out: 800 [Urine:800]  Lab Results:  [facility] 12/16/11 0520 12/16/11 0130 12/15/11 2100  WBC 5.5 6.5 9.1  HGB 9.8* 9.9* 12.7  HCT 29.5* 29.2* 38.5  PLT 238 236 300   BMET  Basename 12/16/11 0520 12/15/11 2100  NA 135 136  K 3.7 3.7  CL 105 100  CO2 24 25  GLUCOSE 122* 131*  BUN 14 16  CREATININE 0.57 0.75  CALCIUM 8.0* 9.1   LFT  Basename 12/15/11 2100  PROT 6.7  ALBUMIN 4.0  AST 15  ALT 9  ALKPHOS 60  BILITOT 0.5  BILIDIR --  IBILI --   PT/INR  Basename 12/15/11 2340 12/15/11 2130  LABPROT 13.9 13.5  INR 1.08 1.04    Hepatitis Panel No results found for this basename: HEPBSAG,HCVAB,HEPAIGM,HEPBIGM in the last 72 hours  Studies/Results: No results found.   ASSESSMENT:   Active Problems:  Hemorrhage of gastrointestinal tract, unspecified  Post-polypectomy bleeding     PLAN:   Observe  During the day, if bleeding continues through mid day, will do flex sigmoidoscopy and appmpt hemostasis with an endoclip, Full liquid diet     LOS: 1 day   Lina Sar  12/16/2011, 9:00 AM

## 2011-12-16 NOTE — ED Notes (Signed)
Called to give report nurse unavailable will call back.  

## 2011-12-16 NOTE — Plan of Care (Signed)
Problem: Phase I Progression Outcomes Goal: Initial discharge plan identified Outcome: Progressing Pt to be discharged without any GI Bleeding. She will be able to notify the MD for any problems with blood in her stool, along with any nausea or vomiting.

## 2011-12-16 NOTE — Telephone Encounter (Signed)
  Follow up Call-  Call back number 12/15/2011  Post procedure Call Back phone  # [phone]  Permission to leave phone message Yes     Patient questions:  Do you have a fever, pain , or abdominal swelling? no Pain Score  0 *  Have you tolerated food without any problems? yes  Have you been able to return to your normal activities? no  Do you have any questions about your discharge instructions: Diet   no Medications  no Follow up visit  no  Do you have questions or concerns about your Care? no  Actions: * If pain score is 4 or above: No action needed, pain <4.  Pt is in the hospital.  Admitted last pm after several episodes of large amount of blood passed.  States Dr. Juanda Chance advised her to go to ER and that she had more bleeding episodes during last evening.  States bleeding Has decreased some as of this AM and that she really doesn't feel bad.  Will fill out Variance Form.

## 2011-12-17 LAB — CBC
HCT: 27.2 % — ABNORMAL LOW (ref 36.0–46.0)
HCT: 29.2 % — ABNORMAL LOW (ref 36.0–46.0)
Hemoglobin: 9.1 g/dL — ABNORMAL LOW (ref 12.0–15.0)
Hemoglobin: 9.6 g/dL — ABNORMAL LOW (ref 12.0–15.0)
MCH: 28.4 pg (ref 26.0–34.0)
MCH: 28.8 pg (ref 26.0–34.0)
MCHC: 32.9 g/dL (ref 30.0–36.0)
MCHC: 33.8 g/dL (ref 30.0–36.0)
MCV: 85 fL (ref 78.0–100.0)
MCV: 85.4 fL (ref 78.0–100.0)
MCV: 85.3 fL (ref 78.0–100.0)
Platelets: 228 10*3/uL (ref 150–400)
RBC: 3.2 MIL/uL — ABNORMAL LOW (ref 3.87–5.11)
RDW: 13.9 % (ref 11.5–15.5)

## 2011-12-17 NOTE — Progress Notes (Signed)
Patient had small bowel movement with minimal blood clots. Will continue to monitor throughout shift. [place], Charity fundraiser

## 2011-12-17 NOTE — Progress Notes (Signed)
Subjective: Cross cover [facility]-GI Patient passed a small amount of old clots this morning. She denies having any abdominal pain, nausea or vomiting. She is tolerating her diet well.   Objective: Vital signs in last 24 hours: Temp:  [98.5 F (36.9 C)-99 F (37.2 C)] 99 F (37.2 C) (10/26 0500) Pulse Rate:  [77-82] 79  (10/26 0500) Resp:  [16-18] 18  (10/26 0500) BP: (87-96)/(57-62) 96/62 mmHg (10/26 0500) SpO2:  [98 %-100 %] 99 % (10/26 0500) Last BM Date: 12/16/11  Intake/Output from previous day: 10/25 0701 - 10/26 0700 In: 2129.2 [P.O.:600; I.V.:1529.2] Out: 3800 [Urine:3800] Intake/Output this shift: Total I/O In: 360 [P.O.:360] Out: -   General appearance: alert, cooperative and no distress Resp: clear to auscultation bilaterally Cardio: regular rate and rhythm, S1, S2 normal, no murmur, click, rub or gallop GI: soft, non-tender; bowel sounds normal; no masses,  no organomegaly  Lab Results:  Basename 12/17/11 0430 12/16/11 2054 12/16/11 1235  WBC 4.2 5.5 5.1  HGB 9.1* 9.5* 9.6*  HCT 27.2* 28.4* 28.6*  PLT 238 247 240   BMET  Basename 12/16/11 0520 12/15/11 2100  NA 135 136  K 3.7 3.7  CL 105 100  CO2 24 25  GLUCOSE 122* 131*  BUN 14 16  CREATININE 0.57 0.75  CALCIUM 8.0* 9.1   LFT  Basename 12/15/11 2100  PROT 6.7  ALBUMIN 4.0  AST 15  ALT 9  ALKPHOS 60  BILITOT 0.5  BILIDIR --  IBILI --   PT/INR  Basename 12/15/11 2340 12/15/11 2130  LABPROT 13.9 13.5  INR 1.08 1.04   H Medications: I have reviewed the patient's current medications.  Assessment/Plan: Post-polypectomy bleed: will watch her another day. Her hemoglobin is trending down slowly though the drop is not significant at this time.   LOS: 2 days   Danuel Felicetti 12/17/2011, 12:07 PM

## 2011-12-18 LAB — CBC
Hemoglobin: 9.3 g/dL — ABNORMAL LOW (ref 12.0–15.0)
MCHC: 33.2 g/dL (ref 30.0–36.0)
MCHC: 33.2 g/dL (ref 30.0–36.0)
MCV: 85.3 fL (ref 78.0–100.0)
Platelets: 226 10*3/uL (ref 150–400)
Platelets: 253 10*3/uL (ref 150–400)
RBC: 3.26 MIL/uL — ABNORMAL LOW (ref 3.87–5.11)
RDW: 13.6 % (ref 11.5–15.5)
WBC: 4.1 10*3/uL (ref 4.0–10.5)

## 2011-12-18 NOTE — Progress Notes (Signed)
Patient had a formed bowel movement with minimal clots. Will continue to monitor throughout shift. [place], Charity fundraiser

## 2011-12-18 NOTE — Progress Notes (Signed)
Patient given all discharge instructions and expressed understanding. Also explained MyChart instructions. Will be discharged home with family.

## 2011-12-18 NOTE — Discharge Summary (Signed)
Physician Discharge Summary  Patient ID: Nancy Ellis MRN: [id number] DOB/AGE: July 30, 1950 61 y.o.  Admit date: 12/15/2011 Discharge date: 12/18/2011  Admission Diagnoses:  1) Post-polypectomy bleed.  2) Anemia secondary to 1. Discharge Diagnoses:  1) Anemia s/p postpolypectomy bleed.  Active Problems:  Hemorrhage of gastrointestinal tract, unspecified  Post-polypectomy bleeding  Discharged Condition: stable  Hospital Course: Patient was admitted with a pot-polypectomy bleed; passed some old heme yesterday and today but hemoglobin has remained stable at 9.3 gms/dl. She denies any active GI complaints at this time.   Consults: None  Significant Diagnostic Studies: labs: CBC'S.  Treatments: IV hydration  Discharge Exam: Blood pressure 106/59, pulse 71, temperature 98.5 F (36.9 C), temperature source Oral, resp. rate 17, height 5\' 5"  (1.651 m), weight 59.3 kg (130 lb 11.7 oz), SpO2 96.00%. General appearance: alert, cooperative, appears stated age and no distress Chest : CTA S1 and S2 are regular.  Abdomen is soft, NTNABS     Medication List     As of 12/18/2011  2:59 PM    ASK your doctor about these medications         CALCIUM 600 PO   Take 1 tablet by mouth 2 (two) times daily.      STOP NAPROSYN 250 MG tablet   Generic drug: naproxen   .AVOID ALL NSAIDS FOR NOW.       Follow up with Dr. Melvia Heaps in 1 week. Please call if you have any further bleeding, dizziness, weakness, etc.  Signed: Helena Sardo 12/18/2011, 2:59 PM

## 2011-12-19 NOTE — ED Provider Notes (Signed)
Medical screening examination/treatment/procedure(s) were performed by non-physician practitioner and as supervising physician I was immediately available for consultation/collaboration.  Lianni Kanaan R. Zarianna Dicarlo, MD 12/19/11 0956 

## 2011-12-20 ENCOUNTER — Telehealth: Payer: Self-pay | Admitting: Gastroenterology

## 2011-12-20 NOTE — Telephone Encounter (Signed)
Pt states she was discharged from the hospital and told to follow-up with Dr. Arlyce Dice in 1 week. Pt scheduled to see Dr. Arlyce Dice tomorrow at 3:30pm. Pt aware of appt date and time.

## 2011-12-21 ENCOUNTER — Encounter: Payer: Self-pay | Admitting: Gastroenterology

## 2011-12-21 ENCOUNTER — Ambulatory Visit (INDEPENDENT_AMBULATORY_CARE_PROVIDER_SITE_OTHER): Payer: Commercial Managed Care - PPO | Admitting: Gastroenterology

## 2011-12-21 VITALS — BP 96/62 | HR 84 | Ht 65.0 in | Wt 130.0 lb

## 2011-12-21 DIAGNOSIS — IMO0002 Reserved for concepts with insufficient information to code with codable children: Secondary | ICD-10-CM

## 2011-12-21 DIAGNOSIS — R197 Diarrhea, unspecified: Secondary | ICD-10-CM

## 2011-12-21 DIAGNOSIS — Z860100 Personal history of colon polyps, unspecified: Secondary | ICD-10-CM

## 2011-12-21 NOTE — Patient Instructions (Addendum)
Follow up as needed

## 2011-12-21 NOTE — Progress Notes (Signed)
History of Present Illness:  Followup office visit after colonoscopy one week ago for workup of diarrhea. A large, sessile right colon polyp was removed after submucosal injection. She developed hematochezia 24 hours later and was hospitalized for 2 days. Bleeding subsided spontaneously. Pathology demonstrated a tubulovillous adenoma. No dysplasia was seen. She's had no further bleeding. Stools tend to be loose and watery, as before. She is without abdominal pain.    Review of Systems: Pertinent positive and negative review of systems were noted in the above HPI section. All other review of systems were otherwise negative.    Current Medications, Allergies, Past Medical History, Past Surgical History, Family History and Social History were reviewed in [employer] electronic medical record  Vital signs were reviewed in today's medical record. Physical Exam: General: Well developed , well nourished, no acute distress

## 2011-12-21 NOTE — Assessment & Plan Note (Signed)
Bleeding resolved spontaneously.

## 2011-12-21 NOTE — Assessment & Plan Note (Signed)
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  Recommendations #1 fiber supplementation #2 if #1 is not sufficient I will add cholestyramine

## 2011-12-21 NOTE — Assessment & Plan Note (Signed)
In view of the size of the polyp will repeat colonoscopy in 3 years

## 2012-01-24 ENCOUNTER — Ambulatory Visit: Payer: Commercial Managed Care - PPO | Admitting: Gastroenterology

## 2012-04-02 ENCOUNTER — Other Ambulatory Visit: Payer: Self-pay | Admitting: Dermatology

## 2012-04-07 ENCOUNTER — Other Ambulatory Visit: Payer: Self-pay

## 2012-10-03 ENCOUNTER — Ambulatory Visit (INDEPENDENT_AMBULATORY_CARE_PROVIDER_SITE_OTHER): Payer: Commercial Managed Care - PPO | Admitting: Gastroenterology

## 2012-10-03 ENCOUNTER — Encounter: Payer: Self-pay | Admitting: Gastroenterology

## 2012-10-03 VITALS — BP 100/76 | HR 76 | Ht 64.75 in | Wt 134.5 lb

## 2012-10-03 DIAGNOSIS — R1012 Left upper quadrant pain: Secondary | ICD-10-CM

## 2012-10-03 DIAGNOSIS — D649 Anemia, unspecified: Secondary | ICD-10-CM

## 2012-10-03 NOTE — Patient Instructions (Addendum)
Return to see Dr Kaplan as needed. 

## 2012-10-03 NOTE — Assessment & Plan Note (Addendum)
Etiology for transient left upper quadrant pain is unclear.  CT findings raise the question of low-grade diverticulitis, but this does not correlate with her symptoms.  Recent colonoscopy mitigates against a neoplasm.  Recommendations. #1 no further GI workup unless symptoms progress.  I carefully instructed the patient to contact me should that occur at which point I would consider antibiotic therapy for presumed diverticulitis.

## 2012-10-03 NOTE — Assessment & Plan Note (Signed)
Last hemoglobin 10.3, following a post polypectomy bleed.  Will recheck.

## 2012-10-03 NOTE — Progress Notes (Signed)
History of Present Illness: Nancy Ellis has returned for evaluation of an abnormal CT scan.  She's been c/o shooting, transient pain in the LUQ lasting only seconds at a time.  CT scan, which I reviewed, demonstrated advanced diverticulosis in the transverse and sigmoid colon, with mild, focal wall thickening in the proximal transverse colon with some trace stranding in the pericolonic fat.  Patient's symptoms have not progressed.  She denies fever, change in bowel habits, or rectal bleeding.  A sessile ascending colon polyp was removed almost one year ago.    Past Medical History  Diagnosis Date  . Diverticulosis   . 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 GI (gastrointestinal hemorrhage)    Past Surgical History  Procedure Laterality Date  . Tubal ligation    . Breast enhancement surgery     family history includes Colon polyps in her mother; Diabetes in her maternal grandmother and sister; Heart attack in her father; Rheum arthritis in her mother. Current Outpatient Prescriptions  Medication Sig Dispense Refill  . Multiple Vitamin (MULTIVITAMIN) tablet Take 1 tablet by mouth daily.      . Calcium Carbonate (CALCIUM 600 PO) Take 1 tablet by mouth 2 (two) times daily.       No current facility-administered medications for this visit.   Allergies as of 10/03/2012  . (No Known Allergies)    reports that she has quit smoking. Her smoking use included Cigarettes. She smoked 0.00 packs per day. She has never used smokeless tobacco. She reports that she does not drink alcohol or use illicit drugs.     Review of Systems: Pertinent positive and negative review of systems were noted in the above HPI section. All other review of systems were otherwise negative.  Vital signs were reviewed in today's medical record Physical Exam: General: Well developed , well nourished, no acute distress Skin: anicteric Head: Normocephalic and atraumatic Eyes:  sclerae anicteric, EOMI Ears:  Normal auditory acuity Mouth: No deformity or lesions Neck: Supple, no masses or thyromegaly Lungs: Clear throughout to auscultation Heart: Regular rate and rhythm; no murmurs, rubs or bruits Abdomen: Soft, non tender and non distended. No masses, hepatosplenomegaly or hernias noted. Normal Bowel sounds Rectal:deferred Musculoskeletal: Symmetrical with no gross deformities  Skin: No lesions on visible extremities Pulses:  Normal pulses noted Extremities: No clubbing, cyanosis, edema or deformities noted Neurological: Alert oriented x 4, grossly nonfocal Cervical Nodes:  No significant cervical adenopathy Inguinal Nodes: No significant inguinal adenopathy Psychological:  Alert and cooperative. Normal mood and affect

## 2012-12-27 ENCOUNTER — Other Ambulatory Visit: Payer: Self-pay

## 2013-08-20 ENCOUNTER — Other Ambulatory Visit: Payer: Self-pay | Admitting: Dermatology

## 2014-05-01 DIAGNOSIS — J069 Acute upper respiratory infection, unspecified: Secondary | ICD-10-CM | POA: Insufficient documentation

## 2014-11-04 ENCOUNTER — Encounter: Payer: Self-pay | Admitting: Gastroenterology

## 2014-12-12 ENCOUNTER — Telehealth: Payer: Self-pay | Admitting: Gastroenterology

## 2014-12-12 NOTE — Telephone Encounter (Signed)
OK to switch to me

## 2014-12-18 ENCOUNTER — Encounter: Payer: Self-pay | Admitting: Gastroenterology

## 2015-01-01 ENCOUNTER — Ambulatory Visit (AMBULATORY_SURGERY_CENTER): Payer: Self-pay | Admitting: *Deleted

## 2015-01-01 VITALS — Ht 65.0 in | Wt 127.8 lb

## 2015-01-01 DIAGNOSIS — Z86010 Personal history of colonic polyps: Secondary | ICD-10-CM

## 2015-01-01 MED ORDER — SUPREP BOWEL PREP KIT 17.5-3.13-1.6 GM/177ML PO SOLN: 1.0000 | Freq: Once | ORAL | Status: DC

## 2015-01-01 NOTE — Progress Notes (Signed)
Patient denies any allergies to egg or soy products. Patient denies complications with anesthesia/sedation.  Patient denies oxygen use at home and denies diet medications. Emmi instructions for colonoscopy explained and given to patient.  

## 2015-01-07 ENCOUNTER — Encounter: Payer: Self-pay | Admitting: Gastroenterology

## 2015-01-07 ENCOUNTER — Ambulatory Visit (AMBULATORY_SURGERY_CENTER): Payer: 59 | Admitting: Gastroenterology

## 2015-01-07 VITALS — BP 106/68 | HR 79 | Temp 99.1°F | Resp 29 | Ht 65.0 in | Wt 127.0 lb

## 2015-01-07 DIAGNOSIS — D123 Benign neoplasm of transverse colon: Secondary | ICD-10-CM

## 2015-01-07 DIAGNOSIS — Z86010 Personal history of colonic polyps: Secondary | ICD-10-CM | POA: Diagnosis not present

## 2015-01-07 MED ORDER — SODIUM CHLORIDE 0.9 % IV SOLN: 500.0000 mL | INTRAVENOUS | Status: DC

## 2015-01-07 NOTE — Progress Notes (Signed)
A/ox3 pleased with MAC, report to Celia RN 

## 2015-01-07 NOTE — Op Note (Signed)
[facility]  [address] [place], [id number]   COLONOSCOPY PROCEDURE REPORT  PATIENT: Nancy, Ellis  MR#: [id number] BIRTHDATE: [birth year] , 63  yrs. old GENDER: female ENDOSCOPIST: Ladene Artist, MD, Marval Regal REFERRED BY:  Antony Contras, M.D. PROCEDURE DATE:  01/07/2015 PROCEDURE:   Colonoscopy, surveillance and Colonoscopy with biopsy First Screening Colonoscopy - Avg.  risk and is 50 yrs.  old or older - No.  Prior Negative Screening - Now for repeat screening. N/A  History of Adenoma - Now for follow-up colonoscopy & has been > or = to 3 yrs.  Yes hx of adenoma.  Has been 3 or more years since last colonoscopy.  Polyps removed today? Yes ASA CLASS:   Class II INDICATIONS:Surveillance due to prior colonic neoplasia and PH Colon Adenoma. MEDICATIONS: Monitored anesthesia care and Propofol 200 mg IV DESCRIPTION OF PROCEDURE:   After the risks benefits and alternatives of the procedure were thoroughly explained, informed consent was obtained.  The digital rectal exam revealed no abnormalities of the rectum.   The LB [id number]  endoscope was introduced through the anus and advanced to the cecum, which was identified by both the appendix and ileocecal valve. No adverse events experienced.   The quality of the prep was excellent. (Suprep was used)  The instrument was then slowly withdrawn as the colon was fully examined. Estimated blood loss is zero unless otherwise noted in this procedure report.    COLON FINDINGS: A sessile polyp measuring 4 mm in size was found in the transverse colon.  A polypectomy was performed with cold forceps. The resection was complete, the polyp tissue was completely retrieved and sent to histology. There was moderate diverticulosis noted in the sigmoid colon and descending colon with associated luminal narrowing and muscular hypertrophy. There was mild diverticulosis noted in the transverse colon. The examination was otherwise  normal. Retroflexed views revealed no abnormalities. The time to cecum = 2.8 Withdrawal time = 9.6   The scope was withdrawn and the procedure completed. COMPLICATIONS: There were no immediate complications.  ENDOSCOPIC IMPRESSION: 1.   Sessile polyp in the transverse colon; polypectomy performed with cold forceps 2.   Moderate diverticulosis in the sigmoid colon and descending colon 3.   Mild diverticulosis in the transverse colon  RECOMMENDATIONS: 1.  Await pathology results 2.  Repeat colonoscopy in 3 years if polyp adenomatous; otherwise 5 years 3.  High fiber diet with liberal fluid intake.  eSigned:  Ladene Artist, MD, [facility] 01/07/2015 8:55 AM

## 2015-01-07 NOTE — Patient Instructions (Signed)
Discharge instructions given. Handouts on polyps and diverticulosis. Resume previous medications. YOU HAD AN ENDOSCOPIC PROCEDURE TODAY AT THE [facility]:   Refer to the procedure report that was given to you for any specific questions about what was found during the examination.  If the procedure report does not answer your questions, please call your gastroenterologist to clarify.  If you requested that your care partner not be given the details of your procedure findings, then the procedure report has been included in a sealed envelope for you to review at your convenience later.  YOU SHOULD EXPECT: Some feelings of bloating in the abdomen. Passage of more gas than usual.  Walking can help get rid of the air that was put into your GI tract during the procedure and reduce the bloating. If you had a lower endoscopy (such as a colonoscopy or flexible sigmoidoscopy) you may notice spotting of blood in your stool or on the toilet paper. If you underwent a bowel prep for your procedure, you may not have a normal bowel movement for a few days.  Please Note:  You might notice some irritation and congestion in your nose or some drainage.  This is from the oxygen used during your procedure.  There is no need for concern and it should clear up in a day or so.  SYMPTOMS TO REPORT IMMEDIATELY:   Following lower endoscopy (colonoscopy or flexible sigmoidoscopy):  Excessive amounts of blood in the stool  Significant tenderness or worsening of abdominal pains  Swelling of the abdomen that is new, acute  Fever of 100F or higher   For urgent or emergent issues, a gastroenterologist can be reached at any hour by calling [phone].   DIET: Your first meal following the procedure should be a small meal and then it is ok to progress to your normal diet. Heavy or fried foods are harder to digest and may make you feel nauseous or bloated.  Likewise, meals heavy in dairy and vegetables can  increase bloating.  Drink plenty of fluids but you should avoid alcoholic beverages for 24 hours.  ACTIVITY:  You should plan to take it easy for the rest of today and you should NOT DRIVE or use heavy machinery until tomorrow (because of the sedation medicines used during the test).    FOLLOW UP: Our staff will call the number listed on your records the next business day following your procedure to check on you and address any questions or concerns that you may have regarding the information given to you following your procedure. If we do not reach you, we will leave a message.  However, if you are feeling well and you are not experiencing any problems, there is no need to return our call.  We will assume that you have returned to your regular daily activities without incident.  If any biopsies were taken you will be contacted by phone or by letter within the next 1-3 weeks.  Please call us at [phone] if you have not heard about the biopsies in 3 weeks.    SIGNATURES/CONFIDENTIALITY: You and/or your care partner have signed paperwork which will be entered into your electronic medical record.  These signatures attest to the fact that that the information above on your After Visit Summary has been reviewed and is understood.  Full responsibility of the confidentiality of this discharge information lies with you and/or your care-partner. 

## 2015-01-07 NOTE — Progress Notes (Signed)
Called to room to assist during endoscopic procedure.  Patient ID and intended procedure confirmed with present staff. Received instructions for my participation in the procedure from the performing physician.  

## 2015-01-08 ENCOUNTER — Telehealth: Payer: Self-pay | Admitting: Emergency Medicine

## 2015-01-08 NOTE — Telephone Encounter (Signed)
  Follow up Call-  Call back number 01/07/2015  Post procedure Call Back phone  # [phone]  Permission to leave phone message Yes     Patient questions:  Do you have a fever, pain , or abdominal swelling? No. Pain Score  0 *  Have you tolerated food without any problems? Yes.    Have you been able to return to your normal activities? Yes.    Do you have any questions about your discharge instructions: Diet   No. Medications  No. Follow up visit  No.  Do you have questions or concerns about your Care? No.  Actions: * If pain score is 4 or above: No action needed, pain <4.

## 2015-01-20 ENCOUNTER — Encounter: Payer: Self-pay | Admitting: Gastroenterology

## 2015-02-25 ENCOUNTER — Encounter: Payer: Commercial Managed Care - PPO | Admitting: Gastroenterology

## 2015-04-13 DIAGNOSIS — D169 Benign neoplasm of bone and articular cartilage, unspecified: Secondary | ICD-10-CM | POA: Insufficient documentation

## 2015-04-13 DIAGNOSIS — R0781 Pleurodynia: Secondary | ICD-10-CM | POA: Insufficient documentation

## 2015-04-13 DIAGNOSIS — Z1211 Encounter for screening for malignant neoplasm of colon: Secondary | ICD-10-CM | POA: Insufficient documentation

## 2015-04-15 ENCOUNTER — Other Ambulatory Visit: Payer: Self-pay | Admitting: Family Medicine

## 2015-04-15 ENCOUNTER — Ambulatory Visit
Admission: RE | Admit: 2015-04-15 | Discharge: 2015-04-15 | Disposition: A | Discharge status: Home / Self Care | Payer: PRIVATE HEALTH INSURANCE | Source: Ambulatory Visit | Attending: Family Medicine | Admitting: Family Medicine

## 2015-04-15 DIAGNOSIS — R0781 Pleurodynia: Secondary | ICD-10-CM

## 2015-05-28 ENCOUNTER — Ambulatory Visit (INDEPENDENT_AMBULATORY_CARE_PROVIDER_SITE_OTHER): Payer: PRIVATE HEALTH INSURANCE | Admitting: Pulmonary Disease

## 2015-05-28 ENCOUNTER — Other Ambulatory Visit (INDEPENDENT_AMBULATORY_CARE_PROVIDER_SITE_OTHER): Payer: PRIVATE HEALTH INSURANCE

## 2015-05-28 ENCOUNTER — Ambulatory Visit (INDEPENDENT_AMBULATORY_CARE_PROVIDER_SITE_OTHER)
Admission: RE | Admit: 2015-05-28 | Discharge: 2015-05-28 | Disposition: A | Discharge status: Home / Self Care | Payer: PRIVATE HEALTH INSURANCE | Source: Ambulatory Visit | Attending: Pulmonary Disease | Admitting: Pulmonary Disease

## 2015-05-28 ENCOUNTER — Encounter: Payer: Self-pay | Admitting: Pulmonary Disease

## 2015-05-28 VITALS — BP 130/80 | HR 85 | Temp 98.6°F | Ht 65.0 in | Wt 131.2 lb

## 2015-05-28 DIAGNOSIS — R079 Chest pain, unspecified: Secondary | ICD-10-CM

## 2015-05-28 LAB — CREATININE, SERUM: Creatinine, Ser: 0.66 mg/dL (ref 0.40–1.20)

## 2015-05-28 LAB — BUN: BUN: 10 mg/dL (ref 6–23)

## 2015-05-28 MED ORDER — IOPAMIDOL (ISOVUE-370) INJECTION 76%
80.0000 mL | Freq: Once | INTRAVENOUS | Status: AC | PRN
  Administered 2015-05-28: 80 mL via INTRAVENOUS

## 2015-05-28 NOTE — Addendum Note (Signed)
Addended by: Raymondo Band D on: 05/28/2015 12:49 PM   Modules accepted: Orders

## 2015-05-28 NOTE — Progress Notes (Signed)
   Subjective:    Patient ID: Nancy Ellis, female    DOB: 1950-09-21, 65 y.o.   MRN: [id number]  HPI Consult for evaluation of chest pain.  Mrs. Normoyle is 65 year old with past History of diverticulosis, squamous cell cancer of the skin, migraine. She has complains of sharp chest pain on the left side for the past 2-3 months. She was initially seen in #2016 for cough and congestion. She is given a Z-Pak which improved the cough but the chest pain persisted. She was then given naproxen which improved her symptoms somewhat but she had to stop using it because of GI side effects. She describes the symptoms as constant sharp pricking left chest pain. There are no relieving or aggravating factors. This is nonradiating in nature. It is not associated with any dyspnea, cough or cough, wheezing, sputum production.  She's had a negative d-dimer last month and a chest x-ray performed at her primary care office that shows hyperinflation.   DATA: CXR 04/15/15 Hyperinflation. No acute infiltrate.  D-dimer 04/14/15-0 0.26.  Social History: She has about 2 pack smoking history. She quit in 1980. She drinks about 1 alcoholic drink a day. 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 Denies exposures at work or at home.  Family History: Asthma-mother Colon polyp-mother Diabetes-sister Heart disease-father Rheumatoid arthritis-mother  Past Medical History  Diagnosis Date  . Diverticulosis   . 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 GI (gastrointestinal hemorrhage)   . Cancer ([place])     skin precancerous lesions on arms- done in derm. office with local    Current outpatient prescriptions:  .  Calcium Citrate-Vitamin D (CALCIUM + D PO), Take 1 tablet by mouth 2 (two) times daily. , Disp: , Rfl:  .  Multiple Vitamin (MULTIVITAMIN) tablet, Take 1 tablet by mouth daily., Disp: , Rfl:    Review of Systems Left-sided, substernal chest pain. Not associated with palpitation. No cough,  sputum production, wheezing, dyspnea, hemoptysis. No fevers, chills, malaise, fatigue, loss of weight, loss of appetite. No nausea, vomiting, diarrhea, consultation. All other review of systems are negative    Objective:   Physical Exam Blood pressure 130/80, pulse 85, temperature 98.6 F (37 C), temperature source Oral, height 5\' 5"  (1.651 m), weight 131 lb 3.2 oz (59.512 kg), SpO2 98 %. Gen: No apparent distress Neuro: No gross focal deficits. HEENT: No JVD, lymphadenopathy, thyromegaly. RS: Clear, No wheeze or crackles CVS: S1-S2 heard, no murmurs rubs gallops. Abdomen: Soft, positive bowel sounds. Musculoskeletal: No edema.    Assessment & Plan:  Chest pain Abnormal chest x-ray with hyperinflation. Minimal smoking history.  She has atypical left chest pain and the chest x-ray does not show any infiltrates. She has an elevated d-dimer making thromboembolism less likely. She is worried about cancer and I will evaluate this by CT of the chest. Hyperinflation on chest x-ray raising the possibility of obstructive lung disease even though she has minimal smoking hisotry. I'll evaluate this by getting PFTs.  Marshell Garfinkel MD [facility] Pager [phone] If no answer or after 3pm call: [phone] 05/28/2015, 12:41 PM

## 2015-05-28 NOTE — Patient Instructions (Signed)
We will schedule you for a CTangiogram of the chest and pulmonary function tests.  Return to clinic in 2-4 weeks to discuss these results and further follow-up as needed.

## 2015-06-01 NOTE — Progress Notes (Signed)
Quick Note:  Called spoke with patient, advised of CT results / recs as stated by PM. Pt verbalized her understanding and denied any questions. ______

## 2015-06-22 ENCOUNTER — Ambulatory Visit: Payer: PRIVATE HEALTH INSURANCE | Admitting: Pulmonary Disease

## 2015-06-24 ENCOUNTER — Encounter (HOSPITAL_COMMUNITY): Payer: PRIVATE HEALTH INSURANCE

## 2015-06-24 ENCOUNTER — Ambulatory Visit: Payer: PRIVATE HEALTH INSURANCE | Admitting: Pulmonary Disease

## 2017-12-26 ENCOUNTER — Encounter: Payer: Self-pay | Admitting: Gastroenterology

## 2018-02-22 ENCOUNTER — Encounter: Payer: Self-pay | Admitting: Gastroenterology

## 2018-02-25 ENCOUNTER — Encounter: Payer: Self-pay | Admitting: Gastroenterology

## 2018-03-01 ENCOUNTER — Other Ambulatory Visit: Payer: Self-pay | Admitting: Podiatry

## 2018-03-01 ENCOUNTER — Ambulatory Visit (INDEPENDENT_AMBULATORY_CARE_PROVIDER_SITE_OTHER): Payer: Medicare Other | Admitting: Podiatry

## 2018-03-01 ENCOUNTER — Ambulatory Visit (INDEPENDENT_AMBULATORY_CARE_PROVIDER_SITE_OTHER): Payer: Medicare Other

## 2018-03-01 ENCOUNTER — Encounter: Payer: Self-pay | Admitting: Podiatry

## 2018-03-01 VITALS — BP 115/71 | HR 69 | Resp 16

## 2018-03-01 DIAGNOSIS — D3613 Benign neoplasm of peripheral nerves and autonomic nervous system of lower limb, including hip: Secondary | ICD-10-CM

## 2018-03-01 DIAGNOSIS — E785 Hyperlipidemia, unspecified: Secondary | ICD-10-CM | POA: Insufficient documentation

## 2018-03-01 DIAGNOSIS — M7750 Other enthesopathy of unspecified foot: Secondary | ICD-10-CM

## 2018-03-01 NOTE — Progress Notes (Signed)
  Subjective:  Patient ID: Nancy Ellis, female    DOB: [birth year],  MRN: [id number] HPI Chief Complaint  Patient presents with  . Foot Pain    1st MPJ through arch left - sharp, shooting pains x few months, intermittent, active in golf and notice worse during play, no treatment  . New Patient (Initial Visit)    68 y.o. female presents with the above complaint.   ROS: Denies fever chills nausea vomiting muscle aches pains calf pain back pain chest pain shortness of breath.  Past Medical History:  Diagnosis Date  . Cancer ([place])    skin precancerous lesions on arms- done in derm. office with local  . Diverticulosis   . GI (gastrointestinal hemorrhage)   . 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 BREAST ENHANCEMENT SURGERY    . COLONOSCOPY  2013   [facility] - hx polyps and tics  . TUBAL LIGATION      Current Outpatient Medications:  Marland Kitchen  VITAMIN D PO, Take by mouth., Disp: , Rfl:  .  Multiple Vitamin (MULTIVITAMIN) tablet, Take 1 tablet by mouth daily., Disp: , Rfl:   No Known Allergies Review of Systems Objective:   Vitals:   03/01/18 1016  BP: 115/71  Pulse: 69  Resp: 16    General: Well developed, nourished, in no acute distress, alert and oriented x3   Dermatological: Skin is warm, dry and supple bilateral. Nails x 10 are well maintained; remaining integument appears unremarkable at this time. There are no open sores, no preulcerative lesions, no rash or signs of infection present.  Vascular: Dorsalis Pedis artery and Posterior Tibial artery pedal pulses are 2/4 bilateral with immedate capillary fill time. Pedal hair growth present. No varicosities and no lower extremity edema present bilateral.   Neruologic: Grossly intact via light touch bilateral. Vibratory intact via tuning fork bilateral. Protective threshold with Semmes Wienstein monofilament intact to all pedal sites bilateral. Patellar and  Achilles deep tendon reflexes 2+ bilateral. No Babinski or clonus noted bilateral.  No reproducible pain on palpation.  Musculoskeletal: No gross boney pedal deformities bilateral. No pain, crepitus, or limitation noted with foot and ankle range of motion bilateral. Muscular strength 5/5 in all groups tested bilateral.  No reproducible pain on palpation.  Gait: Unassisted, Nonantalgic.    Radiographs:  Radiographs taken today demonstrate rectus foot type no acute findings.  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  Plan: Discussed etiology pathology and surgical therapies most likely this is nerve entrapment along the plantar medial nerve around the first metatarsal phalangeal joint area.  So I injected the area today with dexamethasone and local anesthetic after sterile Betadine skin prep.     Calypso Hagarty T. [place], [place]

## 2018-03-19 ENCOUNTER — Ambulatory Visit (AMBULATORY_SURGERY_CENTER): Payer: Self-pay

## 2018-03-19 VITALS — Ht 65.0 in | Wt 134.0 lb

## 2018-03-19 DIAGNOSIS — Z86010 Personal history of colonic polyps: Secondary | ICD-10-CM

## 2018-03-19 MED ORDER — NA SULFATE-K SULFATE-MG SULF 17.5-3.13-1.6 GM/177ML PO SOLN: 1.0000 | Freq: Once | ORAL | 0 refills | Status: AC

## 2018-03-19 NOTE — Progress Notes (Signed)
Denies allergies to eggs or soy products. Denies complication of anesthesia or sedation. Denies use of weight loss medication. Denies use of O2.   Emmi instructions declined.  

## 2018-03-29 ENCOUNTER — Ambulatory Visit (INDEPENDENT_AMBULATORY_CARE_PROVIDER_SITE_OTHER): Payer: Medicare Other | Admitting: Podiatry

## 2018-03-29 ENCOUNTER — Encounter: Payer: Self-pay | Admitting: Podiatry

## 2018-03-29 DIAGNOSIS — D3613 Benign neoplasm of peripheral nerves and autonomic nervous system of lower limb, including hip: Secondary | ICD-10-CM

## 2018-03-29 NOTE — Progress Notes (Signed)
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  She states that after that I still Getting these sharp shooting pains 2-3 times a day on and off since then.  Objective: Vital signs are stable she is alert oriented x3.  Pulses are palpable.  She has no reproducible pain on palpation.  Assessment: Joplin's neuroma medial aspect left foot.  Plan: She states that she will come back for dehydrated alcohol injection should this not improve or should it bother her with golf.

## 2018-04-02 ENCOUNTER — Encounter: Payer: Self-pay | Admitting: Gastroenterology

## 2018-04-02 ENCOUNTER — Ambulatory Visit (AMBULATORY_SURGERY_CENTER): Payer: Medicare Other | Admitting: Gastroenterology

## 2018-04-02 VITALS — BP 98/60 | HR 78 | Temp 97.5°F | Resp 25 | Ht 65.0 in | Wt 134.0 lb

## 2018-04-02 DIAGNOSIS — Z86010 Personal history of colonic polyps: Secondary | ICD-10-CM | POA: Diagnosis not present

## 2018-04-02 MED ORDER — SODIUM CHLORIDE 0.9 % IV SOLN: 500.0000 mL | Freq: Once | INTRAVENOUS | Status: DC

## 2018-04-02 NOTE — Progress Notes (Signed)
Pt's states no medical or surgical changes since previsit or office visit. 

## 2018-04-02 NOTE — Progress Notes (Signed)
A and O x3. Report to RN. Tolerated MAC anesthesia well.

## 2018-04-02 NOTE — Op Note (Signed)
[facility] Patient Name: Nancy Ellis Procedure Date: 04/02/2018 8:52 AM MRN: [id number] Endoscopist: Ladene Artist , MD Age: 68 Referring MD:  Date of Birth: 10-28-1950 Gender: Female Account #: [id number] Procedure:                Colonoscopy Indications:              Surveillance: Personal history of adenomatous                            polyps on last colonoscopy > 3 years ago Medicines:                Monitored Anesthesia Care Procedure:                Pre-Anesthesia Assessment:                           - Prior to the procedure, a History and Physical                            was performed, and patient medications and                            allergies were reviewed. The patient's tolerance of                            previous anesthesia was also reviewed. The risks                            and benefits of the procedure and the sedation                            options and risks were discussed with the patient.                            All questions were answered, and informed consent                            was obtained. Prior Anticoagulants: The patient has                            taken no previous anticoagulant or antiplatelet                            agents. ASA Grade Assessment: II - A patient with                            mild systemic disease. After reviewing the risks                            and benefits, the patient was deemed in                            satisfactory condition to undergo the procedure.  After obtaining informed consent, the colonoscope                            was passed under direct vision. Throughout the                            procedure, the patient's blood pressure, pulse, and                            oxygen saturations were monitored continuously. The                            Colonoscope was introduced through the anus and                            advanced to the the cecum,  identified by                            appendiceal orifice and ileocecal valve. The                            ileocecal valve, appendiceal orifice, and rectum                            were photographed. The quality of the bowel                            preparation was good. The colonoscopy was performed                            without difficulty. The patient tolerated the                            procedure well. Scope In: 9:10:19 AM Scope Out: 9:26:28 AM Scope Withdrawal Time: 0 hours 12 minutes 9 seconds  Total Procedure Duration: 0 hours 16 minutes 9 seconds  Findings:                 The perianal and digital rectal examinations were                            normal.                           Scattered small-mouthed diverticula were found in                            the right colon. There was no evidence of                            diverticular bleeding.                           Multiple medium-mouthed diverticula were found in  the left colon. There was no evidence of                            diverticular bleeding.                           Internal hemorrhoids were found during                            retroflexion. The hemorrhoids were small and Grade                            I (internal hemorrhoids that do not prolapse).                           The exam was otherwise without abnormality on                            direct and retroflexion views. Complications:            No immediate complications. Estimated blood loss:                            None. Estimated Blood Loss:     Estimated blood loss: none. Impression:               - Mild diverticulosis in the right colon. There was                            no evidence of diverticular bleeding.                           - Moderate diverticulosis in the left colon. There                            was no evidence of diverticular bleeding.                           - Internal  hemorrhoids.                           - No specimens collected. Recommendation:           - Repeat colonoscopy in 5 years for surveillance.                           - Patient has a contact number available for                            emergencies. The signs and symptoms of potential                            delayed complications were discussed with the                            patient. Return to normal activities tomorrow.  Written discharge instructions were provided to the                            patient.                           - High fiber diet.                           - Continue present medications. Ladene Artist, MD 04/02/2018 9:30:58 AM This report has been signed electronically.

## 2018-04-02 NOTE — Patient Instructions (Signed)
YOU HAD AN ENDOSCOPIC PROCEDURE TODAY AT THE [facility]:   Refer to the procedure report that was given to you for any specific questions about what was found during the examination.  If the procedure report does not answer your questions, please call your gastroenterologist to clarify.  If you requested that your care partner not be given the details of your procedure findings, then the procedure report has been included in a sealed envelope for you to review at your convenience later.  YOU SHOULD EXPECT: Some feelings of bloating in the abdomen. Passage of more gas than usual.  Walking can help get rid of the air that was put into your GI tract during the procedure and reduce the bloating. If you had a lower endoscopy (such as a colonoscopy or flexible sigmoidoscopy) you may notice spotting of blood in your stool or on the toilet paper. If you underwent a bowel prep for your procedure, you may not have a normal bowel movement for a few days.  Please Note:  You might notice some irritation and congestion in your nose or some drainage.  This is from the oxygen used during your procedure.  There is no need for concern and it should clear up in a day or so.  SYMPTOMS TO REPORT IMMEDIATELY:   Following lower endoscopy (colonoscopy or flexible sigmoidoscopy):  Excessive amounts of blood in the stool  Significant tenderness or worsening of abdominal pains  Swelling of the abdomen that is new, acute  Fever of 100F or higher  For urgent or emergent issues, a gastroenterologist can be reached at any hour by calling [phone].   DIET:  We do recommend a small meal at first, but then you may proceed to your regular diet.  Drink plenty of fluids but you should avoid alcoholic beverages for 24 hours.  ACTIVITY:  You should plan to take it easy for the rest of today and you should NOT DRIVE or use heavy machinery until tomorrow (because of the sedation medicines used during the test).     FOLLOW UP: Our staff will call the number listed on your records the next business day following your procedure to check on you and address any questions or concerns that you may have regarding the information given to you following your procedure. If we do not reach you, we will leave a message.  However, if you are feeling well and you are not experiencing any problems, there is no need to return our call.  We will assume that you have returned to your regular daily activities without incident.  If any biopsies were taken you will be contacted by phone or by letter within the next 1-3 weeks.  Please call us at [phone] if you have not heard about the biopsies in 3 weeks.    SIGNATURES/CONFIDENTIALITY: You and/or your care partner have signed paperwork which will be entered into your electronic medical record.  These signatures attest to the fact that that the information above on your After Visit Summary has been reviewed and is understood.  Full responsibility of the confidentiality of this discharge information lies with you and/or your care-partner. 

## 2018-04-03 ENCOUNTER — Telehealth: Payer: Self-pay

## 2018-04-03 NOTE — Telephone Encounter (Signed)
  Follow up Call-  Call back number 04/02/2018  Post procedure Call Back phone  # [phone]  Permission to leave phone message Yes  Some recent data might be hidden     Patient questions:  Do you have a fever, pain , or abdominal swelling? No. Pain Score  0 *  Have you tolerated food without any problems? Yes.    Have you been able to return to your normal activities? Yes.    Do you have any questions about your discharge instructions: Diet   No. Medications  No. Follow up visit  No.  Do you have questions or concerns about your Care? No.  Actions: * If pain score is 4 or above: No action needed, pain <4.

## 2019-01-11 ENCOUNTER — Other Ambulatory Visit: Payer: Self-pay | Admitting: Orthopedic Surgery

## 2019-03-14 ENCOUNTER — Ambulatory Visit: Payer: Medicare Other

## 2019-04-01 ENCOUNTER — Ambulatory Visit: Payer: Medicare Other

## 2019-04-03 ENCOUNTER — Ambulatory Visit: Payer: Medicare Other

## 2019-12-11 ENCOUNTER — Other Ambulatory Visit: Payer: Self-pay | Admitting: Family Medicine

## 2019-12-11 DIAGNOSIS — M545 Low back pain, unspecified: Secondary | ICD-10-CM

## 2019-12-16 ENCOUNTER — Ambulatory Visit
Admission: RE | Admit: 2019-12-16 | Discharge: 2019-12-16 | Disposition: A | Discharge status: Home / Self Care | Payer: Medicare Other | Source: Ambulatory Visit | Attending: Family Medicine | Admitting: Family Medicine

## 2019-12-16 ENCOUNTER — Other Ambulatory Visit: Payer: Self-pay

## 2019-12-16 DIAGNOSIS — M545 Low back pain, unspecified: Secondary | ICD-10-CM

## 2019-12-29 ENCOUNTER — Other Ambulatory Visit: Payer: Medicare Other

## 2021-01-12 ENCOUNTER — Telehealth: Payer: Self-pay | Admitting: Gastroenterology

## 2021-01-12 NOTE — Telephone Encounter (Signed)
Patient called.  Scheduled an appointment for 03/02/21 at 9:10 a.m.  She is having issues with diarrhea within one hour after eating.  She has tried IB Guard, Metamucil, changes in her diet with no results.  She has lost 10 pounds over the last year.  Does she need to be seen sooner than this?  Please call patient and advise if there is anything else she can try in the interim or let her know if she needs to be seen sooner rather than later.  Thank you.

## 2021-01-12 NOTE — Telephone Encounter (Signed)
Patient with abdominal pain and diarrhea after meals.  She will come in and see Alonza Bogus, PA at 10:30 01/13/21

## 2021-01-12 NOTE — Telephone Encounter (Signed)
Nancy Ellis is Dr Lynne Leader nurse I have forwarded it to her.

## 2021-01-13 ENCOUNTER — Encounter: Payer: Self-pay | Admitting: Gastroenterology

## 2021-01-13 ENCOUNTER — Other Ambulatory Visit (INDEPENDENT_AMBULATORY_CARE_PROVIDER_SITE_OTHER): Payer: Medicare Other

## 2021-01-13 ENCOUNTER — Ambulatory Visit (INDEPENDENT_AMBULATORY_CARE_PROVIDER_SITE_OTHER): Payer: Medicare Other | Admitting: Gastroenterology

## 2021-01-13 VITALS — BP 122/82 | HR 75 | Ht 65.0 in | Wt 123.8 lb

## 2021-01-13 DIAGNOSIS — K580 Irritable bowel syndrome with diarrhea: Secondary | ICD-10-CM | POA: Diagnosis not present

## 2021-01-13 DIAGNOSIS — R1084 Generalized abdominal pain: Secondary | ICD-10-CM

## 2021-01-13 DIAGNOSIS — R197 Diarrhea, unspecified: Secondary | ICD-10-CM

## 2021-01-13 LAB — TSH: TSH: 1.11 u[IU]/mL (ref 0.35–5.50)

## 2021-01-13 NOTE — Progress Notes (Signed)
01/13/2021 Nancy Ellis [id number] 12-19-1950   HISTORY OF PRESENT ILLNESS: This is a 70 year old female who is a patient of Dr. Lynne Leader.  She is here today with complaints of generalized abdominal cramping with associated diarrhea.  She states that she has always struggled with what she thought was probably IBS on occasion.  She would get episodes of diarrhea about an hour or so after eating.  She says that over over the past few years, primarily the past year in particular, her symptoms have gotten much worse.  She says that about 8 out of every 10 times that she eats she will have abdominal cramping followed by diarrhea within about an hour or so after eating.  No nocturnal stools and no rectal bleeding.  She says that this has gotten so severe that its controlling her life.  She says that she works a part-time job and that she avoids eating because she does not want to have to be running to the bathroom.  This has resulted in about a 10 pound weight loss.  She says that she has tried eliminating dairy from her diet.  She has tried probiotics, IBgard, Metamucil fiber supplements without any lasting effect.  She is not really on any medications regularly.  She is not status post cholecystectomy.  Her last colonoscopy was in February 2020 at which time she was found to have only diverticulosis and hemorrhoids.   Past Medical History:  Diagnosis Date   Allergy    Arthritis    Cancer ([place])    skin precancerous lesions on arms- done in derm. 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 Her smoking use included cigarettes. She has a 2.00 pack-year smoking history. She has never used smokeless  tobacco. She reports current alcohol use of about 2.0 standard drinks per week. She reports that she does not use drugs. family history includes Asthma in her mother; Colon polyps in her mother; Diabetes in her maternal grandmother and sister; Heart attack in her father; Rheum arthritis in her mother. No Known Allergies    Outpatient Encounter Medications as of 01/13/2021  Medication Sig   Multiple Vitamin (MULTIVITAMIN) tablet Take 1 tablet by mouth daily.   VITAMIN D PO Take by mouth.   No facility-administered encounter medications on file as of 01/13/2021.    REVIEW OF SYSTEMS  : All other systems reviewed and negative except where noted in the History of Present Illness.   PHYSICAL EXAM: BP 122/82   Pulse 75   Ht 5\' 5"  (1.651 m)   Wt 123 lb 12.8 oz (56.2 kg)   SpO2 98%   BMI 20.60 kg/m  General: Well developed white female in no acute distress Head: Normocephalic and atraumatic Eyes:  Sclerae anicteric, conjunctiva pink. Ears: Normal auditory acuity  Lungs: Clear throughout to auscultation; no W/R/R. Heart: Regular rate and rhythm; no M/R/G. Abdomen: Soft, non-distended.  BS present.  Non-tender. Musculoskeletal: Symmetrical with no gross deformities  Skin: No lesions on visible extremities Extremities: No edema  Neurological: Alert oriented x 4, grossly non-focal Psychological:  Alert and cooperative. Normal mood and affect  ASSESSMENT AND PLAN: *70 year old female with complaints of diarrhea with associated generalized abdominal cramping.  Some of the symptoms have been longstanding to a degree, but worsening over the years, especially the past year.  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  Due to her worsening symptoms we will rule out some other causes.  We will check a TSH and celiac labs.  I am also going to check a C. difficile although I think she is probably low risk for that.  We will also check a pancreatic fecal elastase.  If those are all negative then we will  empirically treat her with a course of Xifaxan 550 mg 3 times daily for 2 weeks to see how she responds.  If all of this fails then may need to consider repeat colonoscopy with random biopsies to rule out microscopic colitis, etc. For now she can use Imodium as needed as this does help her.   CC:  Antony Contras, MD

## 2021-01-13 NOTE — Patient Instructions (Signed)
Your provider has requested that you go to the basement level for lab work before leaving today. Press "B" on the elevator. The lab is located at the first door on the left as you exit the elevator.  If you are age 68 or older, your body mass index should be between 23-30. Your Body mass index is 22.3 kg/m. If this is out of the aforementioned range listed, please consider follow up with your Primary Care Provider.  If you are age 5 or younger, your body mass index should be between 19-25. Your Body mass index is 22.3 kg/m. If this is out of the aformentioned range listed, please consider follow up with your Primary Care Provider.   ________________________________________________________  The [facility] providers would like to encourage you to use [facility] to communicate with providers for non-urgent requests or questions.  Due to long hold times on the telephone, sending your provider a message by [facility] may be a faster and more efficient way to get a response.  Please allow 48 business hours for a response.  Please remember that this is for non-urgent requests.  _______________________________________________________

## 2021-01-15 LAB — IGA: Immunoglobulin A: 230 mg/dL (ref 70–320)

## 2021-01-15 LAB — TISSUE TRANSGLUTAMINASE ABS,IGG,IGA
(tTG) Ab, IgA: <1 U/mL
(tTG) Ab, IgG: <1 U/mL

## 2021-01-18 ENCOUNTER — Other Ambulatory Visit: Payer: Medicare Other

## 2021-01-18 DIAGNOSIS — R1084 Generalized abdominal pain: Secondary | ICD-10-CM

## 2021-01-18 DIAGNOSIS — K580 Irritable bowel syndrome with diarrhea: Secondary | ICD-10-CM

## 2021-01-18 NOTE — Progress Notes (Signed)
Reviewed and agree with management plan.  Stedman [place] T. Baylin Cabal, MD FACG 

## 2021-01-19 LAB — CLOSTRIDIUM DIFFICILE TOXIN B, QUALITATIVE, REAL-TIME PCR: Toxigenic C. Difficile by PCR: NOT DETECTED

## 2021-01-26 LAB — PANCREATIC ELASTASE, FECAL: Pancreatic Elastase-1, Stool: >500 mcg/g

## 2021-01-28 ENCOUNTER — Other Ambulatory Visit: Payer: Self-pay

## 2021-01-28 MED ORDER — RIFAXIMIN 550 MG PO TABS: 550.0000 mg | ORAL_TABLET | Freq: Three times a day (TID) | ORAL | 0 refills | Status: AC

## 2021-03-02 ENCOUNTER — Ambulatory Visit: Payer: Medicare Other | Admitting: Gastroenterology

## 2021-12-22 IMAGING — MR MR LUMBAR SPINE W/O CM
4 of 5 series · 26 of 48 positions shown · non-contrast
Comparison: CT abdomen/pelvis 09/04/2012.

CLINICAL DATA: Bilateral low back pain without sciatica,
unspecified chronicity. Additional history provided by technologist:
pain across both hips.

EXAM:
MRI LUMBAR SPINE WITHOUT CONTRAST
TECHNIQUE: Multiplanar, multisequence MR imaging of the lumbar spine was
performed. No intravenous contrast was administered.

[Series 3: T2 · sagittal · 4.0mm · 1.09mm/px · 6 of 15 slices shown (1 of 2)]
[im 1/15]
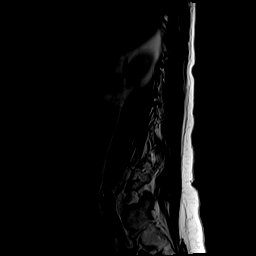
[im 3/15]
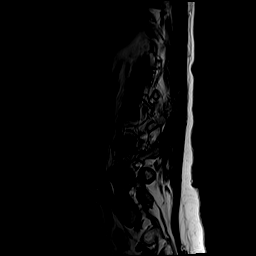
[im 6/15]
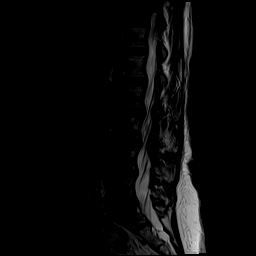
[im 9/15]
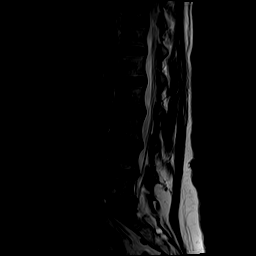
[im 12/15]
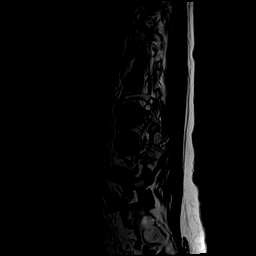
[im 15/15]
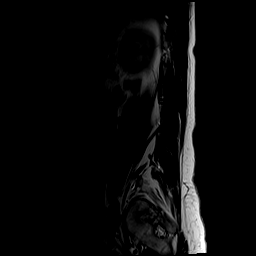

[Series 5: T1 · sagittal · 4.0mm · 1.09mm/px · 6 of 15 slices shown (1 of 2)]
[im 1/15]
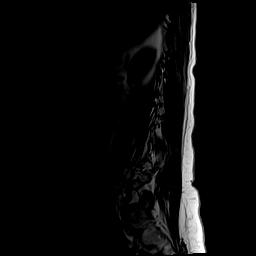
[im 3/15]
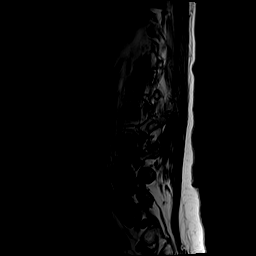
[im 6/15]
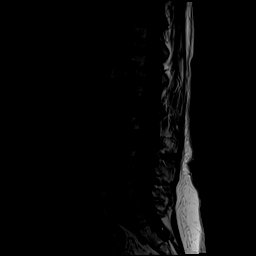
[im 9/15]
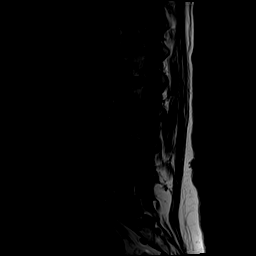
[im 12/15]
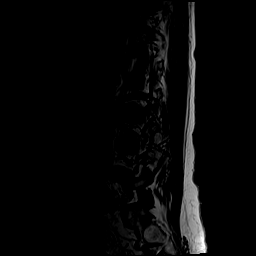
[im 15/15]
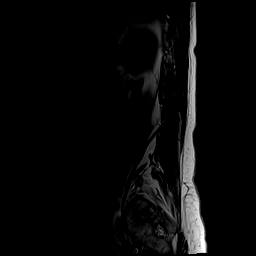

[Series 6: T2 · axial · 4.0mm · 0.39mm/px · z∈[-62,+142]mm · 9 of 40 slices shown (2 of 2)]
[im 1/40]
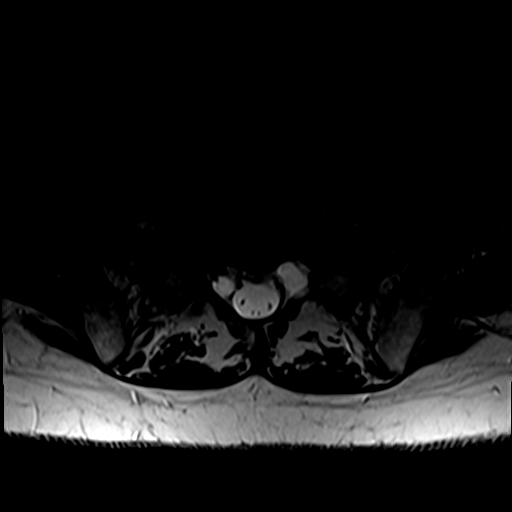
[im 6/40]
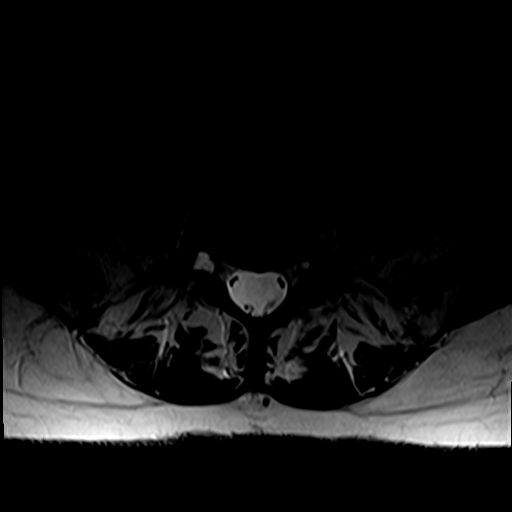
[im 12/40]
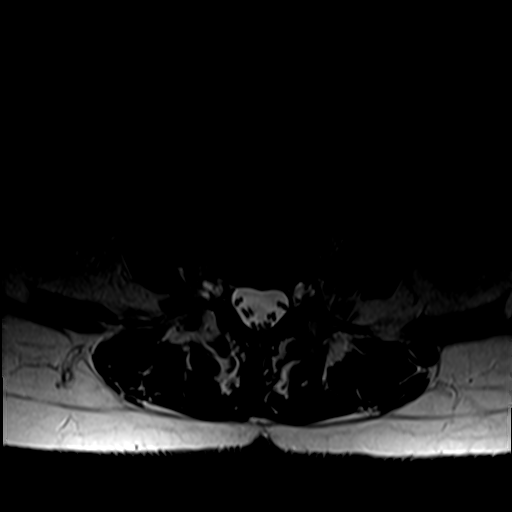
[im 17/40]
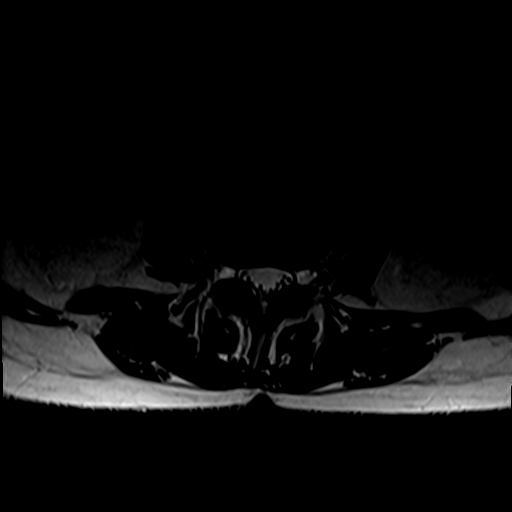
[im 20/40]
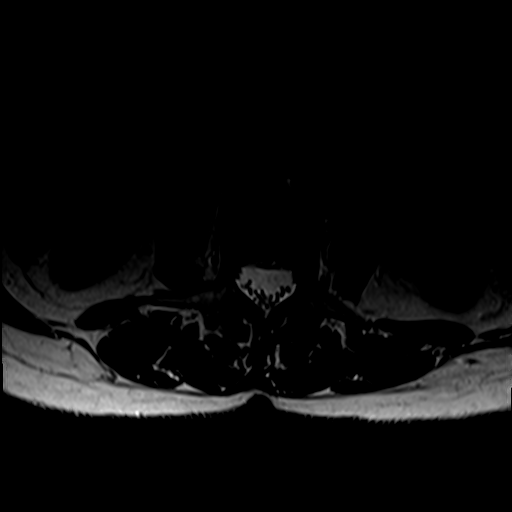
[im 23/40]
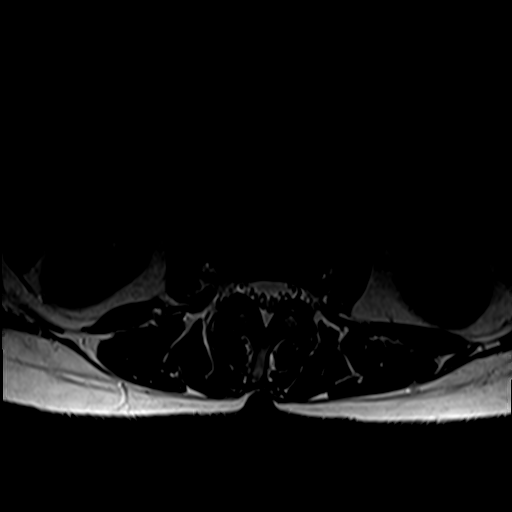
[im 28/40]
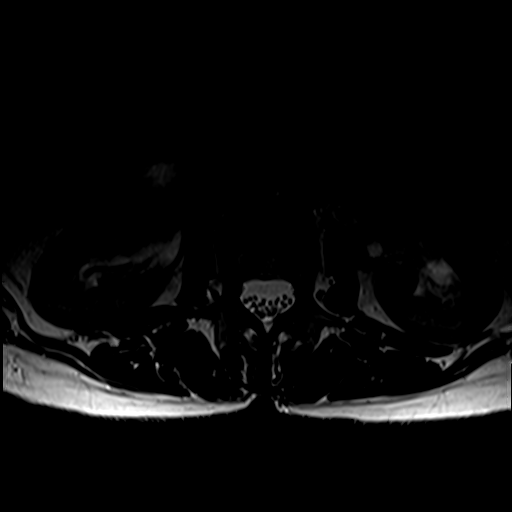
[im 34/40]
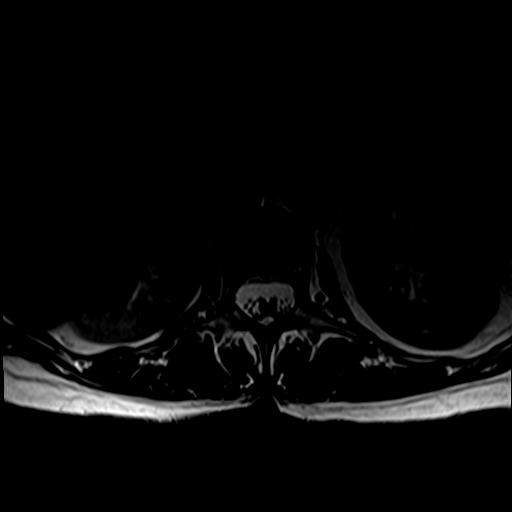
[im 40/40]
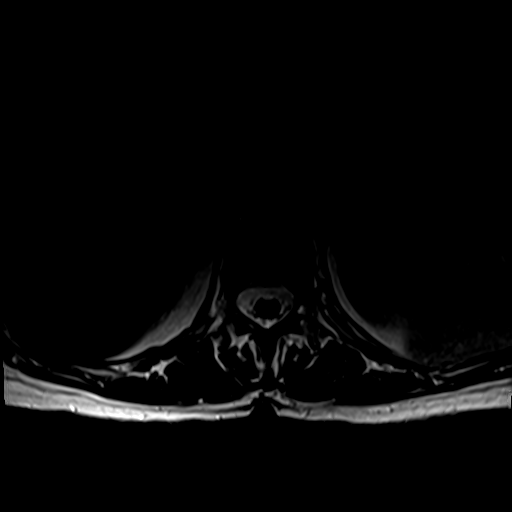

[Series 7: T1 · axial · 4.0mm · 0.39mm/px · z∈[-62,+113]mm · 5 of 40 slices shown (2 of 2)]
[im 1/40]
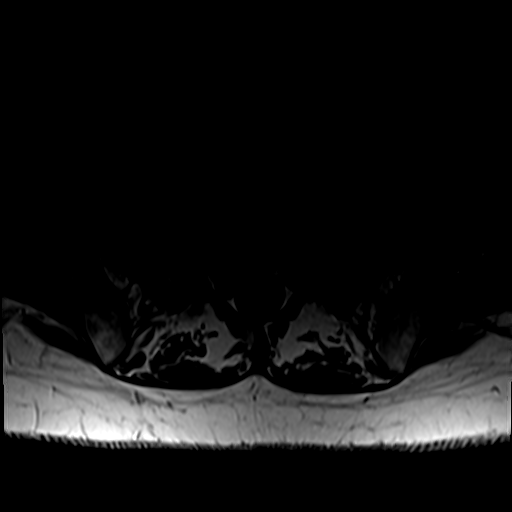
[im 6/40]
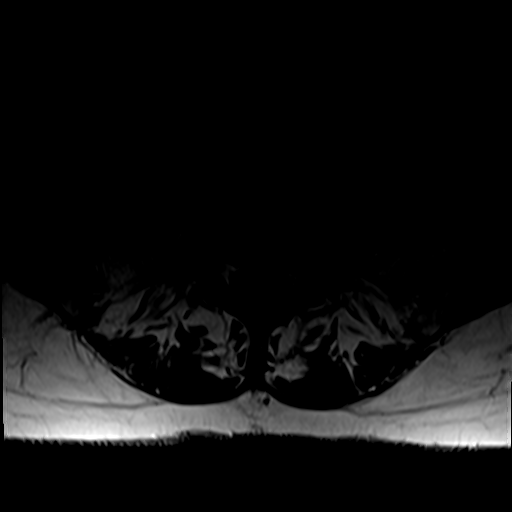
[im 12/40]
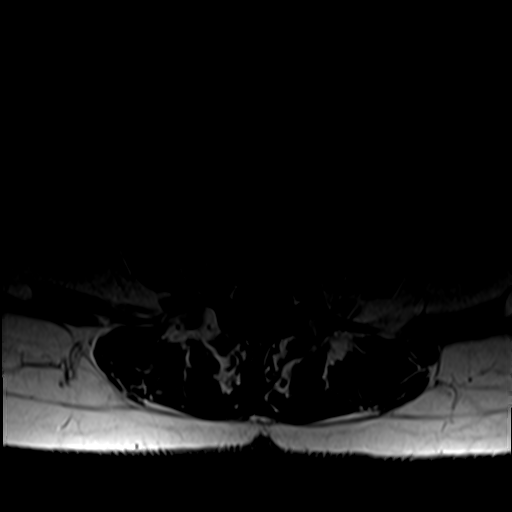
[im 20/40]
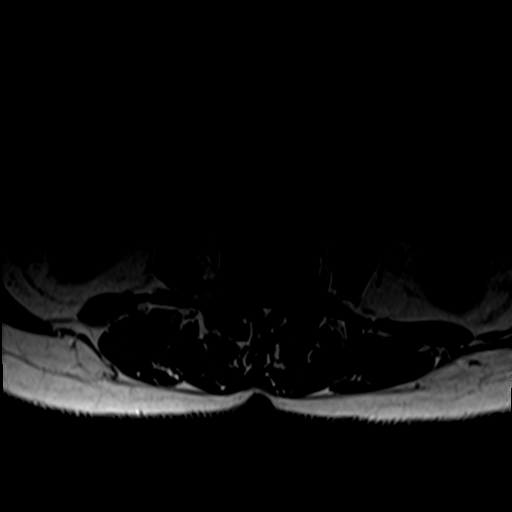
[im 34/40]
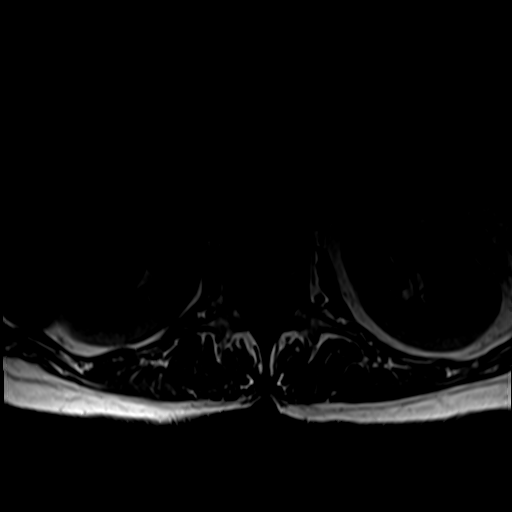

[26 of 48 positions shown; findings below may reference images not displayed]

FINDINGS: Segmentation: Vertebrae (correlating with prior lumbar spine
radiographs 09/04/2012)

Alignment: Straightening of the expected cervical lordosis. 2 mm
L4-L5 grade 1 retrolisthesis.

Vertebrae: No focal suspicious osseous lesion. Mild degenerative
endplate edema at L4-L5.

Conus medullaris and cauda equina: Conus extends to the L2 level. No
signal abnormality within the visualized distal spinal cord.
Multilevel nerve root sheath cyst. Tarlov cysts within the sacral
spinal canal.

Paraspinal and other soft tissues: Cholecystolithiasis. Paraspinal
soft tissues within normal limits.

Disc levels:

Moderate L4-L5 disc degeneration. Mild disc degeneration at the
remaining lumbar levels.

T12-L1: No significant disc herniation or stenosis.

L1-L2: Disc bulge. Mild facet arthrosis. No significant spinal canal
or foraminal stenosis.

L2-L3: Disc bulge. Mild facet arthrosis/ligamentum flavum
hypertrophy. Minimal relative bilateral subarticular narrowing.
Central canal patent. No significant foraminal stenosis.

L3-L4: Disc bulge. Mild facet arthrosis/ligamentum flavum
hypertrophy. Mild bilateral subarticular narrowing without nerve
root impingement. Central canal patent. Mild right neural foraminal
narrowing.

L4-L5: Grade 1 retrolisthesis. Disc bulge with endplate spurring.
Mild facet arthrosis with small bilateral facet joint effusions. No
significant spinal canal stenosis. Mild bilateral neural foraminal
narrowing (greater on the left).

L5-S1: Mild facet arthrosis. No significant disc herniation or
stenosis.
IMPRESSION: Lumbar spondylosis as outlined with findings most notably as
follows.

At L4-L5, there is grade 1 retrolisthesis. Moderate disc
degeneration with mild degenerative endplate edema. Disc bulge with
endplate spurring. Mild facet arthrosis with small bilateral facet
joint effusions. No significant spinal canal stenosis. Mild
bilateral neural foraminal narrowing (greater on the left).

No more than mild disc degeneration, spinal canal stenosis or neural
foraminal narrowing at the remaining levels. Additional levels of
mild facet arthrosis and ligamentum flavum hypertrophy as described.

Cholecystolithiasis.

## 2022-02-17 ENCOUNTER — Encounter: Payer: Self-pay | Admitting: Podiatry

## 2022-02-17 ENCOUNTER — Ambulatory Visit (INDEPENDENT_AMBULATORY_CARE_PROVIDER_SITE_OTHER): Payer: Medicare Other

## 2022-02-17 ENCOUNTER — Ambulatory Visit (INDEPENDENT_AMBULATORY_CARE_PROVIDER_SITE_OTHER): Payer: Medicare Other | Admitting: Podiatry

## 2022-02-17 DIAGNOSIS — M778 Other enthesopathies, not elsewhere classified: Secondary | ICD-10-CM | POA: Diagnosis not present

## 2022-02-17 MED ORDER — TRIAMCINOLONE ACETONIDE 40 MG/ML IJ SUSP
20.0000 mg | Freq: Once | INTRAMUSCULAR | Status: AC
  Administered 2022-02-17: 20 mg

## 2022-02-17 NOTE — Progress Notes (Signed)
    Subjective:  Patient ID: Nancy Ellis, female    DOB: [birth year],  MRN: [id number] HPI Chief Complaint  Patient presents with   Foot Pain    Dorsal midfoot left - knot x several years, mentioned at last visit, but really wasn't that painful at the time, extremely painful with shoes   New Patient (Initial Visit)    Est pt 2020    71 y.o. female presents with the above complaint.   ROS: Denies fever chills nausea vomiting muscle aches pains calf pain back pain chest pain shortness of breath.  Past Medical History:  Diagnosis Date   Allergy    Arthritis    Cancer ([place])    skin precancerous lesions on arms- done in derm. office with local   Diverticulosis    GI (gastrointestinal hemorrhage)    Inflammatory polyps of colon with rectal bleeding [facility])    Past Surgical History:  Procedure Laterality Date   BREAST ENHANCEMENT SURGERY     COLONOSCOPY  2013   [facility] - hx polyps and tics   TUBAL LIGATION      Current Outpatient Medications:    Multiple Vitamin (MULTIVITAMIN) tablet, Take 1 tablet by mouth daily., Disp: , Rfl:   No Known Allergies Review of Systems Objective:  There were no vitals filed for this visit.  General: Well developed, nourished, in no acute distress, alert and oriented x3   Dermatological: Skin is warm, dry and supple bilateral. Nails x 10 are well maintained; remaining integument appears unremarkable at this time. There are no open sores, no preulcerative lesions, no rash or signs of infection present.  Vascular: Dorsalis Pedis artery and Posterior Tibial artery pedal pulses are 2/4 bilateral with immedate capillary fill time. Pedal hair growth present. No varicosities and no lower extremity edema present bilateral.   Neruologic: Grossly intact via light touch bilateral. Vibratory intact via tuning fork bilateral. Protective threshold with Semmes Wienstein monofilament intact to all pedal sites bilateral. Patellar and Achilles deep tendon reflexes 2+  bilateral. No Babinski or clonus noted bilateral.   Musculoskeletal: No gross boney pedal deformities bilateral. No pain, crepitus, or limitation noted with foot and ankle range of motion bilateral. Muscular strength 5/5 in all groups tested bilateral.  Nonpulsatile mass to the dorsal aspect of the first and second tarsometatarsal joint of the left foot.  This appears to be osteoarthritic spurring.   Gait: Unassisted, Nonantalgic.    Radiographs:  Radiographs taken today demonstrate osseously mature foot good mineralization.  She has some osteoarthritic spurring at the dorsal aspect of the midfoot at the first second tarsometatarsal joint articulation.  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  Tolerated procedure well.     Hriday Stai T. [place], [place]

## 2022-06-23 ENCOUNTER — Encounter: Payer: Self-pay | Admitting: Podiatry

## 2022-06-23 ENCOUNTER — Ambulatory Visit (INDEPENDENT_AMBULATORY_CARE_PROVIDER_SITE_OTHER): Payer: Medicare Other | Admitting: Podiatry

## 2022-06-23 DIAGNOSIS — M778 Other enthesopathies, not elsewhere classified: Secondary | ICD-10-CM | POA: Diagnosis not present

## 2022-06-23 NOTE — Progress Notes (Signed)
Presents today for follow-up of her capsulitis she states that is still sore she refers to the dorsal aspect of her left foot.  She states that after the first few days he was feeling fine and then it came right back.  Objective: Vital signs stable alert oriented x 3.  Still has a bump to the dorsal aspect of the left foot.  With tenderness on palpation of the deep peroneal nerve.  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  Plan: Discussed etiology pathology conservative therapy at this point

## 2023-05-10 ENCOUNTER — Other Ambulatory Visit: Payer: Self-pay | Admitting: Family Medicine

## 2023-05-10 DIAGNOSIS — R442 Other hallucinations: Secondary | ICD-10-CM

## 2023-05-18 ENCOUNTER — Encounter: Payer: Self-pay | Admitting: Pediatrics

## 2023-06-11 ENCOUNTER — Other Ambulatory Visit

## 2023-06-13 ENCOUNTER — Encounter

## 2023-06-17 ENCOUNTER — Other Ambulatory Visit

## 2023-06-29 ENCOUNTER — Encounter: Admitting: Pediatrics

## 2023-10-27 ENCOUNTER — Encounter: Payer: Self-pay | Admitting: Gastroenterology

## 2023-11-21 ENCOUNTER — Ambulatory Visit (AMBULATORY_SURGERY_CENTER)

## 2023-11-21 VITALS — Ht 65.0 in | Wt 125.0 lb

## 2023-11-21 DIAGNOSIS — Z860100 Personal history of colon polyps, unspecified: Secondary | ICD-10-CM

## 2023-11-21 MED ORDER — NA SULFATE-K SULFATE-MG SULF 17.5-3.13-1.6 GM/177ML PO SOLN: 1.0000 | Freq: Once | ORAL | 0 refills | Status: AC

## 2023-11-21 NOTE — Progress Notes (Signed)

## 2023-12-05 ENCOUNTER — Encounter: Payer: Self-pay | Admitting: Gastroenterology

## 2023-12-05 ENCOUNTER — Ambulatory Visit: Admitting: Gastroenterology

## 2023-12-05 VITALS — BP 104/58 | HR 56 | Temp 97.5°F | Resp 18 | Ht 65.0 in | Wt 125.0 lb

## 2023-12-05 DIAGNOSIS — K5730 Diverticulosis of large intestine without perforation or abscess without bleeding: Secondary | ICD-10-CM | POA: Diagnosis not present

## 2023-12-05 DIAGNOSIS — Z860100 Personal history of colon polyps, unspecified: Secondary | ICD-10-CM

## 2023-12-05 DIAGNOSIS — Z860101 Personal history of adenomatous and serrated colon polyps: Secondary | ICD-10-CM

## 2023-12-05 DIAGNOSIS — Z1211 Encounter for screening for malignant neoplasm of colon: Secondary | ICD-10-CM | POA: Diagnosis not present

## 2023-12-05 MED ORDER — SODIUM CHLORIDE 0.9 % IV SOLN: 500.0000 mL | INTRAVENOUS | Status: DC

## 2023-12-05 NOTE — Patient Instructions (Addendum)
 Resume previous diet Continue present medications There were no colon polyps seen today!  You will need another screening colonoscopy in 7 years, you will receive a letter at that time when you are due for the procedure.   Please call us  at [phone] if you have a change in bowel habits, change in family history of colo-rectal cancer, rectal bleeding or other GI concern before that time.  Handout given for diverticulosis  YOU HAD AN ENDOSCOPIC PROCEDURE TODAY AT THE [facility]:   Refer to the procedure report that was given to you for any specific questions about what was found during the examination.  If the procedure report does not answer your questions, please call your gastroenterologist to clarify.  If you requested that your care partner not be given the details of your procedure findings, then the procedure report has been included in a sealed envelope for you to review at your convenience later.  YOU SHOULD EXPECT: Some feelings of bloating in the abdomen. Passage of more gas than usual.  Walking can help get rid of the air that was put into your GI tract during the procedure and reduce the bloating. If you had a lower endoscopy (such as a colonoscopy or flexible sigmoidoscopy) you may notice spotting of blood in your stool or on the toilet paper. If you underwent a bowel prep for your procedure, you may not have a normal bowel movement for a few days.  Please Note:  You might notice some irritation and congestion in your nose or some drainage.  This is from the oxygen used during your procedure.  There is no need for concern and it should clear up in a day or so.  SYMPTOMS TO REPORT IMMEDIATELY:  Following lower endoscopy (colonoscopy):  Excessive amounts of blood in the stool  Significant tenderness or worsening of abdominal pains  Swelling of the abdomen that is new, acute  Fever of 100F or higher For urgent or emergent issues, a gastroenterologist can be reached at  any hour by calling [phone]. Do not use MyChart messaging for urgent concerns.   DIET:  We do recommend a small meal at first, but then you may proceed to your regular diet.  Drink plenty of fluids but you should avoid alcoholic beverages for 24 hours.  ACTIVITY:  You should plan to take it easy for the rest of today and you should NOT DRIVE or use heavy machinery until tomorrow (because of the sedation medicines used during the test).    FOLLOW UP: Our staff will call the number listed on your records the next business day following your procedure.  We will call around 7:15- 8:00 am to check on you and address any questions or concerns that you may have regarding the information given to you following your procedure. If we do not reach you, we will leave a message.     SIGNATURES/CONFIDENTIALITY: You and/or your care partner have signed paperwork which will be entered into your electronic medical record.  These signatures attest to the fact that that the information above on your After Visit Summary has been reviewed and is understood.  Full responsibility of the confidentiality of this discharge information lies with you and/or your care-partner.

## 2023-12-05 NOTE — Progress Notes (Signed)
 History and Physical:  This patient presents for endoscopic testing for: Encounter Diagnosis  Name Primary?   Hx of colonic polyps Yes    73 year old woman here today for surveillance colonoscopy with a history of adenomatous polyps. No polyps in February 2020 Diminutive tubular adenoma November 2016 15 mm ascending colon TVA removed by EMR October 2013  Patient denies chronic abdominal pain, rectal bleeding, constipation or diarrhea.  Patient is otherwise without complaints or active issues today.   Past Medical History: Past Medical History:  Diagnosis Date   Allergy    Arthritis    Cancer ([facility])    skin precancerous lesions on arms- done in derm. office with local   Diverticulosis    GI (gastrointestinal hemorrhage)    Inflammatory polyps of colon with rectal bleeding ([facility])      Past Surgical History: Past Surgical History:  Procedure Laterality Date   BREAST ENHANCEMENT SURGERY     COLONOSCOPY  2013   [facility] - hx polyps and tics   TUBAL LIGATION      Allergies: No Known Allergies  Outpatient Meds: Current Outpatient Medications  Medication Sig Dispense Refill   acetaminophen  (TYLENOL  8 HOUR) 650 MG CR tablet Take 650 mg by mouth every 8 (eight) hours as needed.     Multiple Vitamin (MULTIVITAMIN) tablet Take 1 tablet by mouth daily.     Current Facility-Administered Medications  Medication Dose Route Frequency Provider Last Rate Last Admin   0.9 %  sodium chloride  infusion  500 mL Intravenous Continuous Danis, Victory CROME III, MD          ___________________________________________________________________ Objective   Exam:  BP 127/71   Pulse 67   Temp (!) 97.5 F (36.4 C) (Temporal)   Ht 5' 5 (1.651 m)   Wt 125 lb (56.7 kg)   SpO2 97%   BMI 20.80 kg/m   CV: regular , S1/S2 Resp: clear to auscultation bilaterally, normal RR and effort noted GI: soft, no tenderness, with active bowel sounds.   Assessment: Encounter Diagnosis  Name Primary?    Hx of colonic polyps Yes     Plan: Colonoscopy   The benefits and risks of the planned procedure(s) were described in detail with the patient or (when appropriate) their health care proxy.  Risks were outlined as including, but not limited to, bleeding, infection, perforation, adverse medication reaction leading to cardiac or pulmonary decompensation, pancreatitis (if ERCP).  The limitation of incomplete mucosal visualization was also discussed.  No guarantees or warranties were given.  The patient is appropriate for an endoscopic procedure in the ambulatory setting.   - Victory Brand, MD

## 2023-12-05 NOTE — Progress Notes (Signed)
 Transferred to PACU via stretcher, arousing, VSS.

## 2023-12-05 NOTE — Op Note (Signed)
 [facility] Patient Name: Nancy Ellis Procedure Date: 12/05/2023 10:31 AM MRN: [id number] Endoscopist: Victory L. Legrand , MD, [id number] Age: 73 Referring MD:  Date of Birth: [birth year] Gender: Female Account #: [id number] Procedure:                Colonoscopy Indications:              Personal history of colonic polyps                           No polyps February 2020 (Dr. Aneita)                           Tubular adenoma November 2016 (Dr. Aneita)                           10 mm ascending colon TVA removed by EMR over 2013                            (Dr. Debrah) Medicines:                Monitored Anesthesia Care Procedure:                Pre-Anesthesia Assessment:                           - Prior to the procedure, a History and Physical                            was performed, and patient medications and                            allergies were reviewed. The patient's tolerance of                            previous anesthesia was also reviewed. The risks                            and benefits of the procedure and the sedation                            options and risks were discussed with the patient.                            All questions were answered, and informed consent                            was obtained. Prior Anticoagulants: The patient has                            taken no anticoagulant or antiplatelet agents. ASA                            Grade Assessment: II - A patient with mild systemic  disease. After reviewing the risks and benefits,                            the patient was deemed in satisfactory condition to                            undergo the procedure.                           After obtaining informed consent, the colonoscope                            was passed under direct vision. Throughout the                            procedure, the patient's blood pressure, pulse, and                            oxygen  saturations were monitored continuously. The                            CF [id number] #[id number] was introduced through the anus                            and advanced to the the cecum, identified by                            appendiceal orifice and ileocecal valve. The                            colonoscopy was performed without difficulty. The                            patient tolerated the procedure well. The quality                            of the bowel preparation was good. The ileocecal                            valve, appendiceal orifice, and rectum were                            photographed. Scope In: 10:41:37 AM Scope Out: 10:55:16 AM Scope Withdrawal Time: 0 hours 9 minutes 50 seconds  Total Procedure Duration: 0 hours 13 minutes 39 seconds  Findings:                 The perianal and digital rectal examinations were                            normal.                           Repeat examination of right colon under NBI  performed.                           Many diverticula were found in the entire colon.                           The exam was otherwise without abnormality on                            direct and retroflexion views. Complications:            No immediate complications. Estimated Blood Loss:     Estimated blood loss: none. Impression:               - Diverticulosis in the entire examined colon.                           - The examination was otherwise normal on direct                            and retroflexion views.                           - No specimens collected. Recommendation:           - Patient has a contact number available for                            emergencies. The signs and symptoms of potential                            delayed complications were discussed with the                            patient. Return to normal activities tomorrow.                            Written discharge instructions were provided to the                             patient.                           - Resume previous diet.                           - Continue present medications.                           - Repeat colonoscopy in 7 years for surveillance. Tanaisha Pittman L. Legrand, MD 12/05/2023 10:59:44 AM This report has been signed electronically.

## 2023-12-05 NOTE — Progress Notes (Signed)
 Pt's states no medical or surgical changes since previsit or office visit.

## 2023-12-06 ENCOUNTER — Telehealth: Payer: Self-pay

## 2023-12-06 NOTE — Telephone Encounter (Signed)
  Follow up Call-     12/05/2023   10:10 AM  Call back number  Post procedure Call Back phone  # [phone]  Permission to leave phone message Yes     Patient questions:  Do you have a fever, pain , or abdominal swelling? No. Pain Score  0 *  Have you tolerated food without any problems? Yes.    Have you been able to return to your normal activities? Yes.    Do you have any questions about your discharge instructions: Diet   No. Medications  No. Follow up visit  No.  Do you have questions or concerns about your Care? No.  Actions: * If pain score is 4 or above: No action needed, pain <4.
